# Patient Record
Sex: Male | Born: 1995 | Hispanic: Yes | Marital: Single | Smoking: Current every day smoker
Health system: Southern US, Community
[De-identification: ages and names within clinical notes are randomized; demographics above are authoritative.]

## PROBLEM LIST (undated history)

## (undated) DIAGNOSIS — J45909 Unspecified asthma, uncomplicated: Secondary | ICD-10-CM

---

## 2019-02-03 ENCOUNTER — Other Ambulatory Visit: Payer: Self-pay | Admitting: Orthopaedic Surgery

## 2019-02-03 ENCOUNTER — Encounter (HOSPITAL_COMMUNITY): Payer: Self-pay | Admitting: *Deleted

## 2019-02-03 ENCOUNTER — Other Ambulatory Visit: Payer: Self-pay

## 2019-02-03 NOTE — Progress Notes (Signed)
Patient informed of the [place] that is currently in effect.  Patient verbalized understanding.  Patient denies shortness of breath, fever, cough and chest pain.  PCP - [facility] [place], [place] Cardiologist - Denies  Chest x-ray - Denies EKG - Denies Stress Test - Denies ECHO - Denies Cardiac Cath -Denies   ERAS: Clears til 4:30 am.  No ERAS Drink  STOP now taking any Aspirin (unless otherwise instructed by your surgeon), Aleve, Naproxen, Ibuprofen, Motrin, Advil, Goody's, BC's, all herbal medications, fish oil, and all vitamins.   Coronavirus Screening Have you or Sister experienced the following symptoms:  Cough yes/no: No Fever (>100.24F)  yes/no: No Runny nose yes/no: No Sore throat yes/no: No Difficulty breathing/shortness of breath  yes/no: No  Have you or your sister traveled in the last 14 days and where? yes/no: No

## 2019-02-04 ENCOUNTER — Encounter (HOSPITAL_COMMUNITY): Payer: Self-pay

## 2019-02-04 ENCOUNTER — Ambulatory Visit (HOSPITAL_COMMUNITY): Payer: Worker's Compensation | Admitting: Anesthesiology

## 2019-02-04 ENCOUNTER — Other Ambulatory Visit: Payer: Self-pay

## 2019-02-04 ENCOUNTER — Ambulatory Visit (HOSPITAL_COMMUNITY)
Admission: RE | Admit: 2019-02-04 | Discharge: 2019-02-04 | Disposition: A | Discharge status: Home / Self Care | Payer: Worker's Compensation | Attending: Orthopaedic Surgery | Admitting: Orthopaedic Surgery

## 2019-02-04 ENCOUNTER — Encounter (HOSPITAL_COMMUNITY)
Admission: RE | Disposition: A | Discharge status: Home / Self Care | Payer: Self-pay | Source: Home / Self Care | Attending: Orthopaedic Surgery

## 2019-02-04 DIAGNOSIS — Z1159 Encounter for screening for other viral diseases: Secondary | ICD-10-CM | POA: Diagnosis not present

## 2019-02-04 DIAGNOSIS — F17210 Nicotine dependence, cigarettes, uncomplicated: Secondary | ICD-10-CM | POA: Insufficient documentation

## 2019-02-04 DIAGNOSIS — Z8709 Personal history of other diseases of the respiratory system: Secondary | ICD-10-CM | POA: Diagnosis not present

## 2019-02-04 DIAGNOSIS — Y990 Civilian activity done for income or pay: Secondary | ICD-10-CM | POA: Insufficient documentation

## 2019-02-04 DIAGNOSIS — S92422A Displaced fracture of distal phalanx of left great toe, initial encounter for closed fracture: Secondary | ICD-10-CM | POA: Insufficient documentation

## 2019-02-04 DIAGNOSIS — W230XXA Caught, crushed, jammed, or pinched between moving objects, initial encounter: Secondary | ICD-10-CM | POA: Diagnosis not present

## 2019-02-04 DIAGNOSIS — I96 Gangrene, not elsewhere classified: Secondary | ICD-10-CM | POA: Diagnosis not present

## 2019-02-04 HISTORY — PX: CLOSED REDUCTION METATARSAL: SHX5774

## 2019-02-04 HISTORY — PX: AMPUTATION TOE: SHX6595

## 2019-02-04 HISTORY — DX: Unspecified asthma, uncomplicated: J45.909

## 2019-02-04 LAB — HEMOGLOBIN: Hemoglobin: 15.4 g/dL (ref 13.0–17.0)

## 2019-02-04 LAB — SARS CORONAVIRUS 2: SARS Coronavirus 2: NOT DETECTED

## 2019-02-04 SURGERY — CLOSED REDUCTION, FRACTURE, METATARSAL BONE
Anesthesia: General | Site: Foot | Laterality: Left

## 2019-02-04 MED ORDER — CHLORHEXIDINE GLUCONATE 4 % EX LIQD: 60.0000 mL | Freq: Once | CUTANEOUS | Status: DC

## 2019-02-04 MED ORDER — CEFAZOLIN SODIUM-DEXTROSE 2-3 GM-%(50ML) IV SOLR
INTRAVENOUS | Status: DC | PRN
  Administered 2019-02-04: 2 g via INTRAVENOUS

## 2019-02-04 MED ORDER — OXYCODONE HCL 5 MG PO TABS: 5.0000 mg | ORAL_TABLET | ORAL | 0 refills | Status: AC | PRN

## 2019-02-04 MED ORDER — FENTANYL CITRATE (PF) 250 MCG/5ML IJ SOLN
INTRAMUSCULAR | Status: DC | PRN
  Administered 2019-02-04: 50 ug via INTRAVENOUS
  Administered 2019-02-04: 100 ug via INTRAVENOUS
  Administered 2019-02-04: 50 ug via INTRAVENOUS

## 2019-02-04 MED ORDER — PROPOFOL 10 MG/ML IV BOLUS
INTRAVENOUS | Status: DC | PRN
  Administered 2019-02-04: 100 mg via INTRAVENOUS
  Administered 2019-02-04: 200 mg via INTRAVENOUS

## 2019-02-04 MED ORDER — ONDANSETRON HCL 4 MG/2ML IJ SOLN
INTRAMUSCULAR | Status: DC | PRN
  Administered 2019-02-04: 4 mg via INTRAVENOUS

## 2019-02-04 MED ORDER — FENTANYL CITRATE (PF) 100 MCG/2ML IJ SOLN: 25.0000 ug | INTRAMUSCULAR | Status: DC | PRN

## 2019-02-04 MED ORDER — MIDAZOLAM HCL 2 MG/2ML IJ SOLN
INTRAMUSCULAR | Status: AC
  Filled 2019-02-04: qty 2

## 2019-02-04 MED ORDER — DEXAMETHASONE SODIUM PHOSPHATE 10 MG/ML IJ SOLN
INTRAMUSCULAR | Status: AC
  Filled 2019-02-04: qty 2

## 2019-02-04 MED ORDER — MIDAZOLAM HCL 5 MG/5ML IJ SOLN
INTRAMUSCULAR | Status: DC | PRN
  Administered 2019-02-04: 2 mg via INTRAVENOUS

## 2019-02-04 MED ORDER — 0.9 % SODIUM CHLORIDE (POUR BTL) OPTIME
TOPICAL | Status: DC | PRN
  Administered 2019-02-04: 1000 mL

## 2019-02-04 MED ORDER — DEXAMETHASONE SODIUM PHOSPHATE 10 MG/ML IJ SOLN
INTRAMUSCULAR | Status: DC | PRN
  Administered 2019-02-04: 10 mg via INTRAVENOUS

## 2019-02-04 MED ORDER — LIDOCAINE HCL (CARDIAC) PF 100 MG/5ML IV SOSY
PREFILLED_SYRINGE | INTRAVENOUS | Status: DC | PRN
  Administered 2019-02-04: 60 mg via INTRAVENOUS

## 2019-02-04 MED ORDER — OXYCODONE HCL 5 MG PO TABS
ORAL_TABLET | ORAL | Status: AC
  Filled 2019-02-04: qty 1

## 2019-02-04 MED ORDER — VANCOMYCIN HCL IN DEXTROSE 1-5 GM/200ML-% IV SOLN: 1000.0000 mg | INTRAVENOUS | Status: DC

## 2019-02-04 MED ORDER — PROMETHAZINE HCL 25 MG/ML IJ SOLN: 6.2500 mg | INTRAMUSCULAR | Status: DC | PRN

## 2019-02-04 MED ORDER — BUPIVACAINE HCL (PF) 0.5 % IJ SOLN
INTRAMUSCULAR | Status: AC
  Filled 2019-02-04: qty 30

## 2019-02-04 MED ORDER — ACETAMINOPHEN 10 MG/ML IV SOLN: 1000.0000 mg | Freq: Once | INTRAVENOUS | Status: DC | PRN

## 2019-02-04 MED ORDER — DEXMEDETOMIDINE HCL 200 MCG/2ML IV SOLN
INTRAVENOUS | Status: DC | PRN
  Administered 2019-02-04: 20 ug via INTRAVENOUS
  Administered 2019-02-04: 8 ug via INTRAVENOUS

## 2019-02-04 MED ORDER — LIDOCAINE 2% (20 MG/ML) 5 ML SYRINGE
INTRAMUSCULAR | Status: AC
  Filled 2019-02-04: qty 5

## 2019-02-04 MED ORDER — CEPHALEXIN 500 MG PO CAPS: 500.0000 mg | ORAL_CAPSULE | Freq: Four times a day (QID) | ORAL | 0 refills | Status: AC

## 2019-02-04 MED ORDER — LACTATED RINGERS IV SOLN
INTRAVENOUS | Status: DC
  Administered 2019-02-04 (×2): via INTRAVENOUS

## 2019-02-04 MED ORDER — OXYCODONE HCL 5 MG/5ML PO SOLN: 5.0000 mg | Freq: Once | ORAL | Status: AC | PRN

## 2019-02-04 MED ORDER — BUPIVACAINE HCL (PF) 0.5 % IJ SOLN
INTRAMUSCULAR | Status: DC | PRN
  Administered 2019-02-04: 20 mL

## 2019-02-04 MED ORDER — OXYCODONE HCL 5 MG PO TABS
5.0000 mg | ORAL_TABLET | Freq: Once | ORAL | Status: AC | PRN
  Administered 2019-02-04: 16:00:00 5 mg via ORAL

## 2019-02-04 MED ORDER — FENTANYL CITRATE (PF) 250 MCG/5ML IJ SOLN
INTRAMUSCULAR | Status: AC
  Filled 2019-02-04: qty 5

## 2019-02-04 MED ORDER — CEFAZOLIN SODIUM 1 G IJ SOLR
INTRAMUSCULAR | Status: AC
  Filled 2019-02-04: qty 20

## 2019-02-04 MED ORDER — ONDANSETRON HCL 4 MG/2ML IJ SOLN
INTRAMUSCULAR | Status: AC
  Filled 2019-02-04: qty 4

## 2019-02-04 MED ORDER — PROPOFOL 10 MG/ML IV BOLUS
INTRAVENOUS | Status: AC
  Filled 2019-02-04: qty 40

## 2019-02-04 SURGICAL SUPPLY — 33 items
BANDAGE ACE 4X5 VEL STRL LF (GAUZE/BANDAGES/DRESSINGS) ×3 IMPLANT
BNDG COHESIVE 4X5 TAN STRL (GAUZE/BANDAGES/DRESSINGS) ×3 IMPLANT
BNDG ESMARK 4X9 LF (GAUZE/BANDAGES/DRESSINGS) ×3 IMPLANT
COVER SURGICAL LIGHT HANDLE (MISCELLANEOUS) ×3 IMPLANT
DRAPE U-SHAPE 47X51 STRL (DRAPES) ×3 IMPLANT
DURAPREP 26ML APPLICATOR (WOUND CARE) IMPLANT
ELECT REM PT RETURN 9FT ADLT (ELECTROSURGICAL) ×3
ELECTRODE REM PT RTRN 9FT ADLT (ELECTROSURGICAL) ×1 IMPLANT
GAUZE SPONGE 4X4 12PLY STRL (GAUZE/BANDAGES/DRESSINGS) ×3 IMPLANT
GAUZE XEROFORM 1X8 LF (GAUZE/BANDAGES/DRESSINGS) ×3 IMPLANT
GLOVE BIOGEL PI IND STRL 9 (GLOVE) ×1 IMPLANT
GLOVE BIOGEL PI INDICATOR 9 (GLOVE) ×2
GLOVE SURG ORTHO 9.0 STRL STRW (GLOVE) ×3 IMPLANT
GOWN STRL REUS W/ TWL LRG LVL3 (GOWN DISPOSABLE) ×1 IMPLANT
GOWN STRL REUS W/ TWL XL LVL3 (GOWN DISPOSABLE) ×1 IMPLANT
GOWN STRL REUS W/TWL LRG LVL3 (GOWN DISPOSABLE) ×2
GOWN STRL REUS W/TWL XL LVL3 (GOWN DISPOSABLE) ×2
KIT BASIN OR (CUSTOM PROCEDURE TRAY) ×3 IMPLANT
KIT TURNOVER KIT B (KITS) ×3 IMPLANT
NEEDLE 22X1 1/2 (OR ONLY) (NEEDLE) ×3 IMPLANT
NS IRRIG 1000ML POUR BTL (IV SOLUTION) ×3 IMPLANT
PACK ORTHO EXTREMITY (CUSTOM PROCEDURE TRAY) ×3 IMPLANT
PAD ARMBOARD 7.5X6 YLW CONV (MISCELLANEOUS) ×3 IMPLANT
PAD CAST 4YDX4 CTTN HI CHSV (CAST SUPPLIES) ×1 IMPLANT
PADDING CAST COTTON 4X4 STRL (CAST SUPPLIES) ×2
SUT ETHILON 3 0 PS 1 (SUTURE) ×6 IMPLANT
SUT MON AB 3-0 SH 27 (SUTURE) ×2
SUT MON AB 3-0 SH27 (SUTURE) ×1 IMPLANT
SYR CONTROL 10ML LL (SYRINGE) ×3 IMPLANT
TOWEL NATURAL 10PK STERILE (DISPOSABLE) ×3 IMPLANT
TUBE CONNECTING 12'X1/4 (SUCTIONS) ×1
TUBE CONNECTING 12X1/4 (SUCTIONS) ×2 IMPLANT
YANKAUER SUCT BULB TIP NO VENT (SUCTIONS) ×3 IMPLANT

## 2019-02-04 NOTE — H&P (Signed)
Douglas Reyes is an 23 y.o. male.   Chief Complaint: Left foot crush injury at work on 01/07/2019 with second and third toe tip necrosis and hallux distal phalanx fracture HPI: Douglas Reyes is here today for second and third toe amputation and closed treatment of his hallux fracture.  He sustained an injury on 01/07/2019 and was I indeed and closed at an outside facility.  He had open second third toe fractures.  Unfortunately he went on to dry gangrene of his second and third toes distal to the soft tissue repair.  He is here today for partial second third toe amputations.  Patient complains of continued pain in the forefoot.  He has been decreasing.  He has been using a postoperative shoe and heel weightbearing.  He denies any fevers or chills.  Past Medical History:  Diagnosis Date  . Asthma    as a child, no problems as an adult, no inhaler    History reviewed. No pertinent surgical history.  History reviewed. No pertinent family history. Social History:  reports that he has been smoking cigarettes. He has a 0.30 pack-year smoking history. He has never used smokeless tobacco. He reports current alcohol use of about 8.0 standard drinks of alcohol per week. He reports that he does not use drugs.  Allergies: No Known Allergies  No medications prior to admission.    No results found for this or any previous visit (from the past 48 hour(s)). No results found.  Review of Systems  Constitutional: Negative.   Eyes: Negative.   Respiratory: Negative.   Cardiovascular: Negative.   Gastrointestinal: Negative.   Genitourinary: Negative.   Musculoskeletal:       Left foot pain  Skin: Negative.   Neurological: Negative.   Psychiatric/Behavioral: Negative.     Blood pressure 140/84, pulse 84, temperature 97.8 F (36.6 C), temperature source Oral, resp. rate 20, height 5\' 11"  (1.803 m), weight 115.7 kg, SpO2 98 %. Physical Exam  Constitutional: He appears well-developed.  HENT:   Head: Normocephalic.  Eyes: Conjunctivae are normal.  Neck: Neck supple.  Cardiovascular: Normal rate.  Respiratory: Effort normal.  GI: Soft.  Genitourinary:    Penis normal.   Musculoskeletal:     Comments: Left foot in a dressing.  He has swelling with the hallux, second and third toes.  There is dry gangrene necrosis of the second and third toes from at least the level of the PIP joint distally.  Fourth and fifth toes appear without injury.  Foot is swollen otherwise.  He endorses sensation to light touch about the tip of the hallux as well as dorsal and plantar forefoot.  No sign of obvious infection.  With the exception of the second and third toes the rest of the foot is warm and well-perfused.  Neurological: He is alert.  Skin: Skin is warm.  Psychiatric: He has a normal mood and affect.     Assessment/Plan We will proceed with second and third toe amputation of the close treatment of his hallux distal phalanx fracture.  We discussed the risk, benefits alternatives surgery which included but were not limited to wound healing complications, infection, need for further surgery, demonstrating structures, continued development of dry gangrene, continued pain.  We also discussed the perioperative and anesthetic risk which include death.  After weighing these risks he wished to proceed.  Erle Crocker, MD 02/04/2019, 1:42 PM

## 2019-02-04 NOTE — Discharge Instructions (Signed)
DR. Lucia Gaskins FOOT & ANKLE SURGERY POST-OP INSTRUCTIONS   Pain Management 1. The numbing medicine and your leg will last around 8 hours, take a dose of your pain medicine as soon as you feel it wearing off to avoid rebound pain. 2. Keep your foot elevated above heart level.  Make sure that your heel hangs free ('floats'). 3. Take all prescribed medication as directed. 4. If taking narcotic pain medication you may want to use an over-the-counter stool softener to avoid constipation. 5. You may take over-the-counter NSAIDs (ibuprofen, naproxen, etc.) as well as over-the-counter acetaminophen as directed on the packaging as a supplement for your pain and may also use it to wean away from the prescription medication.  Activity ? Heel Weightbearing in post operative shoe ? Postoperatively, you will be placed into a splint which stays on for 2 weeks and then will be changed at your first postop visit.  First Postoperative Visit 1. Your first postop visit will be at least 2 weeks after surgery.  This should be scheduled when you schedule surgery. 2. If you do not have a postoperative visit scheduled please call [phone] to schedule an appointment. 3. At the appointment your incision will be evaluated for suture removal, x-rays will be obtained if necessary.  General Instructions 1. Swelling is very common after foot and ankle surgery.  It often takes 3 months for the foot and ankle to begin to feel comfortable.  Some amount of swelling will persist for 6-12 months. 2. DO NOT change the dressing.  If there is a problem with the dressing (too tight, loose, gets wet, etc.) please contact Dr. Pollie Friar office. 3. DO NOT get the dressing wet.  For showers you can use an over-the-counter cast cover or wrap a washcloth around the top of your dressing and then cover it with a plastic bag and tape it to your leg. 4. DO NOT soak the incision (no tubs, pools, bath, etc.) until you have approval from Dr.  Lucia Gaskins.  Contact Dr. Huel Cote office or go to Emergency Room if: 1. Temperature above 101 F. 2. Increasing pain that is unresponsive to pain medication or elevation 3. Excessive redness or swelling in your foot 4. Dressing problems - excessive bloody drainage, looseness or tightness, or if dressing gets wet 5. Develop pain, swelling, warmth, or discoloration of your calf

## 2019-02-04 NOTE — Op Note (Signed)
Douglas CornersOswaldo Castaneda Reyes male 23 y.o. 02/04/2019  PreOperative Diagnosis: Left second toe necrosis Left third toe necrosis Left hallux distal phalanx fracture  PostOperative Diagnosis: Same  PROCEDURE: Left second toe partial amputation Left third toe partial amputation Closed treatment of left hallux distal phalanx fracture  SURGEON: Dub Mikeshristopher Marice Angelino, MD  ASSISTANT: None  ANESTHESIA: General LMA with local digital blockade using quarter percent Marcaine plain  FINDINGS: Necrosis to the left second toe Necrosis to the left third toe There appeared to be early wet gangrene forming within the necrotic soft tissue of both the second and third necrotic toes  IMPLANTS: None  INDICATIONS:23 y.o. male sustained a crush injury to his left foot on Jan 07, 2019.  He was seen in outside facility and diagnosed with open fractures and soft tissue injury.  He underwent irrigation debridement and closure.  He was seen by me due to the nature of his injury.  He developed necrosis of the second and third toes distal to the site of repair.  He also had a distal phalanx fracture of his hallux.  Due to the necrosis of his skin and tissues he was indicated for partial tip amputations of his toes.  We discussed the risk benefits alternatives surgery which included wound healing complications, continued infection, need for further surgery, pain and he wished to proceed.  PROCEDURE: Patient identified in the preoperative holding area.  The left leg was marked by myself.  Consent was signed by myself and the patient.  Taken operatively supine operative table.  General LMA anesthesia was induced of difficulty.  He was given preoperative antibiotics.  The left lower extremity was prepped and draped in usual sterile fashion.  Surgical timeout was performed.  We began by cutting at the edge of the necrosis and nonviable skin on the second toe.  This was taken circumferentially about the toe.  This was  proximal to the level of the DIP joint which was disarticulated.  Then the PIP joint was identified and the soft tissue was elevated to this joint area.  The PIP joint was disarticulated and there was still not enough soft tissue coverage and therefore the bone was cut with a bone cutter proximal to the condyles of the proximal phalanx.  This allowed for adequate soft tissue closure.  The soft tissue was inspected for viability and necrotic soft tissue was debrided with 15 blade and excised.  We then turned our attention to the third toe.  An incision was made full-thickness on the edge of the necrosis of viable skin.  This taken down to bone.  This was at the level of the DIP joint which again was disarticulated.  We then elevated soft tissue flaps around the middle phalanx back to the PIP joint.  This again was disarticulated and the middle phalanx was removed.  There was an adequate soft tissue coverage to cover the condyle and therefore the condyle was removed of the proximal phalanx.  Then again the tissue was inspected for viability.  Using a 3-0 Monocryl the deep tissue was closed overlying the bone end of the second third toe.  Then 3-0 nylon was used to close the tissue completely.  There was a tension-free closure of the second and third toe.  The fracture of the distal phalanx of the hallux was well reduced and therefore plan treat this in a closed fashion.  The foot was cleaned and soft dressing of Xeroform, 4 x 4's, sterile sheet cotton and Ace wrap were placed.  Their counts are correct at the end the case.  There were no complications.  He tolerated this well.  He was awakened from anesthesia and taken to recovery in stable condition.  No complications.  POST OPERATIVE INSTRUCTIONS: Heel weightbearing left lower extremity Keep dressing in place until follow-up Keep dressing dry. Elevate limb Call the office with concerns He will follow-up in 2 weeks for wound check and possible suture  removal no x-rays needed He will take Keflex x5 days for infection prophylaxis  TOURNIQUET TIME: No tourniquet was used  BLOOD LOSS:  Minimal         DRAINS: none         SPECIMEN: none       COMPLICATIONS:  * No complications entered in OR log *         Disposition: PACU - hemodynamically stable.         Condition: stable

## 2019-02-04 NOTE — Anesthesia Postprocedure Evaluation (Signed)
Anesthesia Post Note  Patient: Douglas Reyes  Procedure(s) Performed: CLOSED REDUCTION OF HALLUX FRACTURE (Left Foot) AMPUTATION LEFT 2ND AND 3RD TOE (Left Foot)     Patient location during evaluation: PACU Anesthesia Type: General Level of consciousness: awake and alert Pain management: pain level controlled Vital Signs Assessment: post-procedure vital signs reviewed and stable Respiratory status: spontaneous breathing, nonlabored ventilation, respiratory function stable and patient connected to nasal cannula oxygen Cardiovascular status: blood pressure returned to baseline and stable Postop Assessment: no apparent nausea or vomiting Anesthetic complications: no    Last Vitals:  Vitals:   02/04/19 1630 02/04/19 1645  BP:  122/66  Pulse: 85   Resp: 14   Temp:  (!) 36.1 C  SpO2: 98%     Last Pain:  Vitals:   02/04/19 1645  TempSrc:   PainSc: 1                  Effie Berkshire

## 2019-02-04 NOTE — Anesthesia Postprocedure Evaluation (Deleted)
Anesthesia Post Note  Patient: Douglas Reyes  Procedure(s) Performed: CLOSED REDUCTION OF HALLUX FRACTURE (Left Foot) AMPUTATION LEFT 2ND AND 3RD TOE (Left Foot)     Patient location during evaluation: PACU Anesthesia Type: General Level of consciousness: awake and alert Pain management: pain level controlled Vital Signs Assessment: post-procedure vital signs reviewed and stable Respiratory status: spontaneous breathing, nonlabored ventilation, respiratory function stable and patient connected to nasal cannula oxygen Cardiovascular status: blood pressure returned to baseline and stable Postop Assessment: no apparent nausea or vomiting Anesthetic complications: no    Last Vitals:  Vitals:   02/04/19 1201  BP: 140/84  Pulse: 84  Resp: 20  Temp: 36.6 C  SpO2: 98%    Last Pain:  Vitals:   02/04/19 1249  TempSrc:   PainSc: 0-No pain                 Effie Berkshire

## 2019-02-04 NOTE — Anesthesia Procedure Notes (Signed)
Procedure Name: LMA Insertion Date/Time: 02/04/2019 3:03 PM Performed by: Mariea Clonts, CRNA Pre-anesthesia Checklist: Patient identified, Emergency Drugs available, Suction available and Patient being monitored Patient Re-evaluated:Patient Re-evaluated prior to induction Oxygen Delivery Method: Circle System Utilized Preoxygenation: Pre-oxygenation with 100% oxygen Induction Type: IV induction Ventilation: Mask ventilation without difficulty LMA: LMA inserted LMA Size: 5.0 Number of attempts: 1 Airway Equipment and Method: Bite block Placement Confirmation: positive ETCO2 Tube secured with: Tape Dental Injury: Teeth and Oropharynx as per pre-operative assessment

## 2019-02-04 NOTE — Anesthesia Preprocedure Evaluation (Addendum)
Anesthesia Evaluation  Patient identified by MRN, date of birth, ID band Patient awake    Reviewed: Allergy & Precautions, NPO status , Patient's Chart, lab work & pertinent test results  Airway Mallampati: I  TM Distance: >3 FB Neck ROM: Full    Dental  (+) Teeth Intact, Dental Advisory Given   Pulmonary Current Smoker,    breath sounds clear to auscultation       Cardiovascular negative cardio ROS   Rhythm:Regular Rate:Normal     Neuro/Psych negative neurological ROS     GI/Hepatic negative GI ROS, Neg liver ROS,   Endo/Other  negative endocrine ROS  Renal/GU negative Renal ROS     Musculoskeletal negative musculoskeletal ROS (+)   Abdominal (+) + obese,   Peds  Hematology negative hematology ROS (+)   Anesthesia Other Findings Day of surgery medications reviewed with the patient.  Reproductive/Obstetrics                            Anesthesia Physical Anesthesia Plan  ASA: I  Anesthesia Plan: General   Post-op Pain Management:    Induction: Intravenous  PONV Risk Score and Plan: 2 and Ondansetron, Dexamethasone and Midazolam  Airway Management Planned: LMA  Additional Equipment: None  Intra-op Plan:   Post-operative Plan: Extubation in OR  Informed Consent: I have reviewed the patients History and Physical, chart, labs and discussed the procedure including the risks, benefits and alternatives for the proposed anesthesia with the patient or authorized representative who has indicated his/her understanding and acceptance.     Dental advisory given  Plan Discussed with: CRNA  Anesthesia Plan Comments:        Anesthesia Quick Evaluation

## 2019-02-04 NOTE — Transfer of Care (Signed)
Immediate Anesthesia Transfer of Care Note  Patient: Douglas Reyes  Procedure(s) Performed: CLOSED REDUCTION OF HALLUX FRACTURE (Left Foot) AMPUTATION LEFT 2ND AND 3RD TOE (Left Foot)  Patient Location: PACU  Anesthesia Type:General  Level of Consciousness: awake, alert  and oriented  Airway & Oxygen Therapy: Patient Spontanous Breathing and Patient connected to nasal cannula oxygen  Post-op Assessment: Report given to RN, Post -op Vital signs reviewed and stable and Patient moving all extremities X 4  Post vital signs: Reviewed and stable  Last Vitals:  Vitals Value Taken Time  BP 117/75 02/04/19 1556  Temp    Pulse 81 02/04/19 1610  Resp 18 02/04/19 1610  SpO2 99 % 02/04/19 1610  Vitals shown include unvalidated device data.  Last Pain:  Vitals:   02/04/19 1249  TempSrc:   PainSc: 0-No pain         Complications: No apparent anesthesia complications

## 2019-02-05 ENCOUNTER — Encounter (HOSPITAL_COMMUNITY): Payer: Self-pay | Admitting: Orthopaedic Surgery

## 2019-02-07 ENCOUNTER — Emergency Department (HOSPITAL_COMMUNITY): Payer: HRSA Program

## 2019-02-07 ENCOUNTER — Encounter (HOSPITAL_COMMUNITY): Payer: Self-pay

## 2019-02-07 ENCOUNTER — Observation Stay (HOSPITAL_COMMUNITY)
Admission: EM | Admit: 2019-02-07 | Discharge: 2019-02-08 | Disposition: A | Discharge status: Home / Self Care | Payer: HRSA Program | Attending: Internal Medicine | Admitting: Internal Medicine

## 2019-02-07 DIAGNOSIS — A419 Sepsis, unspecified organism: Secondary | ICD-10-CM | POA: Diagnosis not present

## 2019-02-07 DIAGNOSIS — Z716 Tobacco abuse counseling: Secondary | ICD-10-CM | POA: Diagnosis not present

## 2019-02-07 DIAGNOSIS — J069 Acute upper respiratory infection, unspecified: Secondary | ICD-10-CM | POA: Diagnosis not present

## 2019-02-07 DIAGNOSIS — Z89422 Acquired absence of other left toe(s): Secondary | ICD-10-CM | POA: Diagnosis not present

## 2019-02-07 DIAGNOSIS — R6510 Systemic inflammatory response syndrome (SIRS) of non-infectious origin without acute organ dysfunction: Secondary | ICD-10-CM

## 2019-02-07 DIAGNOSIS — F17210 Nicotine dependence, cigarettes, uncomplicated: Secondary | ICD-10-CM | POA: Diagnosis not present

## 2019-02-07 DIAGNOSIS — Z720 Tobacco use: Secondary | ICD-10-CM | POA: Diagnosis present

## 2019-02-07 DIAGNOSIS — S98132A Complete traumatic amputation of one left lesser toe, initial encounter: Secondary | ICD-10-CM | POA: Diagnosis present

## 2019-02-07 DIAGNOSIS — U071 COVID-19: Secondary | ICD-10-CM | POA: Diagnosis not present

## 2019-02-07 LAB — COMPREHENSIVE METABOLIC PANEL
ALT: 132 U/L — ABNORMAL HIGH (ref 0–44)
AST: 54 U/L — ABNORMAL HIGH (ref 15–41)
Albumin: 4.3 g/dL (ref 3.5–5.0)
Alkaline Phosphatase: 49 U/L (ref 38–126)
Anion gap: 10 (ref 5–15)
BUN: 11 mg/dL (ref 6–20)
CO2: 22 mmol/L (ref 22–32)
Calcium: 9.2 mg/dL (ref 8.9–10.3)
Chloride: 104 mmol/L (ref 98–111)
Creatinine, Ser: 0.96 mg/dL (ref 0.61–1.24)
GFR calc Af Amer: >60 mL/min (ref >60)
GFR calc non Af Amer: >60 mL/min (ref >60)
Glucose, Bld: 113 mg/dL — ABNORMAL HIGH (ref 70–99)
Potassium: 4.1 mmol/L (ref 3.5–5.1)
Sodium: 136 mmol/L (ref 135–145)
Total Bilirubin: 0.6 mg/dL (ref 0.3–1.2)
Total Protein: 7.3 g/dL (ref 6.5–8.1)

## 2019-02-07 LAB — CBC WITH DIFFERENTIAL/PLATELET
Abs Immature Granulocytes: 0.15 10*3/uL — ABNORMAL HIGH (ref 0.00–0.07)
Basophils Absolute: 0.1 10*3/uL (ref 0.0–0.1)
Basophils Relative: 1 %
Eosinophils Absolute: 0.1 10*3/uL (ref 0.0–0.5)
Eosinophils Relative: 2 %
HCT: 48.4 % (ref 39.0–52.0)
Hemoglobin: 16.4 g/dL (ref 13.0–17.0)
Immature Granulocytes: 2 %
Lymphocytes Relative: 14 %
Lymphs Abs: 1 10*3/uL (ref 0.7–4.0)
MCH: 28 pg (ref 26.0–34.0)
MCHC: 33.9 g/dL (ref 30.0–36.0)
MCV: 82.7 fL (ref 80.0–100.0)
Monocytes Absolute: 1.3 10*3/uL — ABNORMAL HIGH (ref 0.1–1.0)
Monocytes Relative: 17 %
Neutro Abs: 4.9 10*3/uL (ref 1.7–7.7)
Neutrophils Relative %: 64 %
Platelets: 188 10*3/uL (ref 150–400)
RBC: 5.85 MIL/uL — ABNORMAL HIGH (ref 4.22–5.81)
RDW: 12.3 % (ref 11.5–15.5)
WBC: 7.6 10*3/uL (ref 4.0–10.5)
nRBC: 0 % (ref 0.0–0.2)

## 2019-02-07 LAB — LACTIC ACID, PLASMA: Lactic Acid, Venous: 1.4 mmol/L (ref 0.5–1.9)

## 2019-02-07 MED ORDER — SODIUM CHLORIDE 0.9% FLUSH
3.0000 mL | Freq: Once | INTRAVENOUS | Status: AC
  Administered 2019-02-08: 3 mL via INTRAVENOUS

## 2019-02-07 NOTE — ED Triage Notes (Signed)
Pt states that last Thursday he had two of his toe amputated on his L foot, today he started to run a fever of 101 yesterday, pt also reports cough since yesterday,  abd pain and headache.

## 2019-02-08 ENCOUNTER — Other Ambulatory Visit: Payer: Self-pay

## 2019-02-08 ENCOUNTER — Emergency Department (HOSPITAL_COMMUNITY): Payer: HRSA Program

## 2019-02-08 ENCOUNTER — Encounter (HOSPITAL_COMMUNITY): Payer: Self-pay | Admitting: Emergency Medicine

## 2019-02-08 DIAGNOSIS — U071 COVID-19: Principal | ICD-10-CM

## 2019-02-08 DIAGNOSIS — S98132A Complete traumatic amputation of one left lesser toe, initial encounter: Secondary | ICD-10-CM | POA: Diagnosis not present

## 2019-02-08 DIAGNOSIS — A419 Sepsis, unspecified organism: Secondary | ICD-10-CM

## 2019-02-08 DIAGNOSIS — J069 Acute upper respiratory infection, unspecified: Secondary | ICD-10-CM | POA: Diagnosis not present

## 2019-02-08 DIAGNOSIS — J45909 Unspecified asthma, uncomplicated: Secondary | ICD-10-CM | POA: Insufficient documentation

## 2019-02-08 DIAGNOSIS — Z720 Tobacco use: Secondary | ICD-10-CM | POA: Diagnosis not present

## 2019-02-08 HISTORY — DX: COVID-19: U07.1

## 2019-02-08 HISTORY — DX: Sepsis, unspecified organism: A41.9

## 2019-02-08 HISTORY — DX: Acute upper respiratory infection, unspecified: J06.9

## 2019-02-08 LAB — URINALYSIS, ROUTINE W REFLEX MICROSCOPIC
Bilirubin Urine: NEGATIVE
Glucose, UA: NEGATIVE mg/dL
Hgb urine dipstick: NEGATIVE
Ketones, ur: NEGATIVE mg/dL
Leukocytes,Ua: NEGATIVE
Nitrite: NEGATIVE
Protein, ur: NEGATIVE mg/dL
Specific Gravity, Urine: 1.029 (ref 1.005–1.030)
pH: 5 (ref 5.0–8.0)

## 2019-02-08 LAB — LACTATE DEHYDROGENASE: LDH: 170 U/L (ref 98–192)

## 2019-02-08 LAB — RESPIRATORY PANEL BY PCR

## 2019-02-08 LAB — COMPREHENSIVE METABOLIC PANEL
ALT: 119 U/L — ABNORMAL HIGH (ref 0–44)
AST: 47 U/L — ABNORMAL HIGH (ref 15–41)
Albumin: 4 g/dL (ref 3.5–5.0)
Alkaline Phosphatase: 45 U/L (ref 38–126)
Anion gap: 10 (ref 5–15)
BUN: 14 mg/dL (ref 6–20)
CO2: 21 mmol/L — ABNORMAL LOW (ref 22–32)
Calcium: 8.9 mg/dL (ref 8.9–10.3)
Chloride: 103 mmol/L (ref 98–111)
Creatinine, Ser: 1.05 mg/dL (ref 0.61–1.24)
GFR calc Af Amer: >60 mL/min (ref >60)
GFR calc non Af Amer: >60 mL/min (ref >60)
Glucose, Bld: 112 mg/dL — ABNORMAL HIGH (ref 70–99)
Potassium: 3.9 mmol/L (ref 3.5–5.1)
Sodium: 134 mmol/L — ABNORMAL LOW (ref 135–145)
Total Bilirubin: 0.7 mg/dL (ref 0.3–1.2)
Total Protein: 6.7 g/dL (ref 6.5–8.1)

## 2019-02-08 LAB — SARS CORONAVIRUS 2: SARS Coronavirus 2: DETECTED — AB

## 2019-02-08 LAB — PROTIME-INR
INR: 1.1 (ref 0.8–1.2)
Prothrombin Time: 14 seconds (ref 11.4–15.2)

## 2019-02-08 LAB — SEDIMENTATION RATE: Sed Rate: 1 mm/hr (ref 0–16)

## 2019-02-08 LAB — C-REACTIVE PROTEIN: CRP: 1 mg/dL — ABNORMAL HIGH (ref <1.0)

## 2019-02-08 LAB — TROPONIN I: Troponin I: <0.03 ng/mL (ref <0.03)

## 2019-02-08 LAB — TYPE AND SCREEN
ABO/RH(D): A POS
Antibody Screen: NEGATIVE

## 2019-02-08 LAB — HIV ANTIBODY (ROUTINE TESTING W REFLEX): HIV Screen 4th Generation wRfx: NONREACTIVE

## 2019-02-08 LAB — FIBRINOGEN: Fibrinogen: 379 mg/dL (ref 210–475)

## 2019-02-08 LAB — LACTIC ACID, PLASMA
Lactic Acid, Venous: 0.7 mmol/L (ref 0.5–1.9)
Lactic Acid, Venous: 1.2 mmol/L (ref 0.5–1.9)

## 2019-02-08 LAB — PROCALCITONIN: Procalcitonin: <0.1 ng/mL

## 2019-02-08 LAB — ABO/RH: ABO/RH(D): A POS

## 2019-02-08 LAB — TRIGLYCERIDES: Triglycerides: 78 mg/dL (ref <150)

## 2019-02-08 LAB — BRAIN NATRIURETIC PEPTIDE: B Natriuretic Peptide: 5.3 pg/mL (ref 0.0–100.0)

## 2019-02-08 LAB — FERRITIN: Ferritin: 203 ng/mL (ref 24–336)

## 2019-02-08 LAB — D-DIMER, QUANTITATIVE: D-Dimer, Quant: 0.43 ug/mL-FEU (ref 0.00–0.50)

## 2019-02-08 MED ORDER — CEPHALEXIN 500 MG PO CAPS
500.0000 mg | ORAL_CAPSULE | Freq: Four times a day (QID) | ORAL | Status: DC
  Administered 2019-02-08 (×2): 500 mg via ORAL
  Filled 2019-02-08 (×2): qty 1

## 2019-02-08 MED ORDER — DM-GUAIFENESIN ER 30-600 MG PO TB12: 1.0000 | ORAL_TABLET | Freq: Two times a day (BID) | ORAL | Status: DC | PRN

## 2019-02-08 MED ORDER — VANCOMYCIN HCL IN DEXTROSE 1-5 GM/200ML-% IV SOLN: 1000.0000 mg | Freq: Once | INTRAVENOUS | Status: DC

## 2019-02-08 MED ORDER — LEVALBUTEROL HCL 1.25 MG/0.5ML IN NEBU
1.2500 mg | INHALATION_SOLUTION | Freq: Four times a day (QID) | RESPIRATORY_TRACT | Status: DC
  Filled 2019-02-08: qty 0.5

## 2019-02-08 MED ORDER — NICOTINE 21 MG/24HR TD PT24: 21.0000 mg | MEDICATED_PATCH | Freq: Every day | TRANSDERMAL | 0 refills | Status: DC

## 2019-02-08 MED ORDER — OXYCODONE HCL 5 MG PO TABS: 5.0000 mg | ORAL_TABLET | ORAL | Status: DC | PRN

## 2019-02-08 MED ORDER — ACETAMINOPHEN 500 MG PO TABS
1000.0000 mg | ORAL_TABLET | Freq: Once | ORAL | Status: AC
  Administered 2019-02-08: 01:00:00 1000 mg via ORAL
  Filled 2019-02-08: qty 2

## 2019-02-08 MED ORDER — ENOXAPARIN SODIUM 40 MG/0.4ML ~~LOC~~ SOLN
40.0000 mg | SUBCUTANEOUS | Status: DC
  Administered 2019-02-08: 40 mg via SUBCUTANEOUS
  Filled 2019-02-08: qty 0.4

## 2019-02-08 MED ORDER — SODIUM CHLORIDE 0.9 % IV SOLN
2.0000 g | Freq: Once | INTRAVENOUS | Status: AC
  Administered 2019-02-08: 01:00:00 2 g via INTRAVENOUS
  Filled 2019-02-08: qty 2

## 2019-02-08 MED ORDER — ONDANSETRON HCL 4 MG/2ML IJ SOLN: 4.0000 mg | Freq: Three times a day (TID) | INTRAMUSCULAR | Status: DC | PRN

## 2019-02-08 MED ORDER — METRONIDAZOLE IN NACL 5-0.79 MG/ML-% IV SOLN
500.0000 mg | Freq: Once | INTRAVENOUS | Status: AC
  Administered 2019-02-08: 500 mg via INTRAVENOUS
  Filled 2019-02-08: qty 100

## 2019-02-08 MED ORDER — ACETAMINOPHEN 325 MG PO TABS: 650.0000 mg | ORAL_TABLET | Freq: Four times a day (QID) | ORAL | Status: DC | PRN

## 2019-02-08 MED ORDER — NICOTINE 21 MG/24HR TD PT24
21.0000 mg | MEDICATED_PATCH | Freq: Every day | TRANSDERMAL | Status: DC
  Filled 2019-02-08: qty 1

## 2019-02-08 MED ORDER — VANCOMYCIN HCL 10 G IV SOLR
2500.0000 mg | Freq: Once | INTRAVENOUS | Status: AC
  Administered 2019-02-08: 02:00:00 2500 mg via INTRAVENOUS
  Filled 2019-02-08: qty 2500

## 2019-02-08 NOTE — Discharge Summary (Signed)
Douglas Reyes is a 23 y.o. male with medical history significant of tobacco abuse, childhood asthma, who presents with cough and fever. Recent L foot surgery POD# 4. No new findings on L foot xray. No radiographic signs of osteomyelitis. Discussed with ortho Dr. Berenice Primas who will continue to follow outpatient. Keep scheduled appointment.  Please refer to H&P dictated by Dr Blaine Hamper on 02/08/19 for further details of the assessment and plan.

## 2019-02-08 NOTE — Consult Note (Signed)
[place] Nurse wound consult note Reason for Consult:S/P amputation of left second and third toes 02/04/2019. Readmitted to hospital. No surgical infection noted, instead (+) covid-19.  Is discharging today and needs follow up wound care instruction.  Surgical dressing was removed to assess for any infection.  Replace this dressing with Xeroform and gauze/tape and follow up with orthopedic surgeon next week as already scheduled.  No showering until this appointment. Patient is given this instruction per bedside RN while I am on the phone and he verbalizes understanding. He currently has dressing in place.  Wound type:Trauma then surgical  Pressure Injury POA: NA Measurement: amputation of left second and third toe.  Wound bed: Sutures to left second and third toe site.  Drainage (amount, consistency, odor) minimal serosanguinous Periwound:hallux fracture repaired during surgery.  Otherwise intact Dressing procedure/placement/frequency: Xeroform dressing in place.  Remain in place until follow up appointment next week;  Patient informed.  Will not follow at this time.  Please re-consult if needed.  Domenic Moras MSN, RN, FNP-BC [facility] Wound, Ostomy, Continence Nurse Pager [phone]

## 2019-02-08 NOTE — Consult Note (Signed)
Reason for Consult:patient being admitted for concerns of sepsis but turns out to have corona virus Referring Physician: hospitalists  Douglas Reyes is an 23 y.o. male.  HPI: the patient underwent amputation surgery of toes several days ago.  He is a relatively healthy person who had uncomplicated surgery.  There were no significant anticipation of surgical problems.  He ultimately presented to the emergency room with concerns of sepsis.  Wound and foot were essentially normal for postoperative condition according to the emergency room physician.  I had a verbal consult with her and we discussed the findings of his wound.  We discussed the possibility of pulmonary embolism given the timing and vital signs.  Ultimately she diagnosed the patient with corona virus.  He is to be admitted and based on this information  I think that we will be available to see him that will not impart additional risk by evaluating him unless he has additional problems with his wound.  Past Medical History:  Diagnosis Date  . Asthma    as a child, no problems as an adult, no inhaler    Past Surgical History:  Procedure Laterality Date  . AMPUTATION TOE Left 02/04/2019   Procedure: AMPUTATION LEFT 2ND AND 3RD TOE;  Surgeon: Erle Crocker, MD;  Location: [facility];  Service: Orthopedics;  Laterality: Left;  . CLOSED REDUCTION METATARSAL Left 02/04/2019   Procedure: CLOSED REDUCTION OF HALLUX FRACTURE;  Surgeon: Erle Crocker, MD;  Location: [facility];  Service: Orthopedics;  Laterality: Left;    No family history on file.  Social History:  reports that he has been smoking cigarettes. He has a 0.30 pack-year smoking history. He has never used smokeless tobacco. He reports current alcohol use of about 8.0 standard drinks of alcohol per week. He reports that he does not use drugs.  Allergies: No Known Allergies  Medications: I have reviewed the patient's current medications.  Results for orders placed  or performed during the hospital encounter of 02/07/19 (from the past 48 hour(s))  Lactic acid, plasma     Status: None   Collection Time: 02/07/19  9:44 PM  Result Value Ref Range   Lactic Acid, Venous 1.4 0.5 - 1.9 mmol/L    Comment: Performed at [facility], [address], [place], [place] [address]  Comprehensive metabolic panel     Status: Abnormal   Collection Time: 02/07/19  9:44 PM  Result Value Ref Range   Sodium 136 135 - 145 mmol/L   Potassium 4.1 3.5 - 5.1 mmol/L   Chloride 104 98 - 111 mmol/L   CO2 22 22 - 32 mmol/L   Glucose, Bld 113 (H) 70 - 99 mg/dL   BUN 11 6 - 20 mg/dL   Creatinine, Ser 0.96 0.61 - 1.24 mg/dL   Calcium 9.2 8.9 - 10.3 mg/dL   Total Protein 7.3 6.5 - 8.1 g/dL   Albumin 4.3 3.5 - 5.0 g/dL   AST 54 (H) 15 - 41 U/L   ALT 132 (H) 0 - 44 U/L   Alkaline Phosphatase 49 38 - 126 U/L   Total Bilirubin 0.6 0.3 - 1.2 mg/dL   GFR calc non Af Amer >60 >60 mL/min   GFR calc Af Amer >60 >60 mL/min   Anion gap 10 5 - 15    Comment: Performed at [facility] [address] [place], [place] [address]  CBC with Differential     Status: Abnormal   Collection Time: 02/07/19  9:44 PM  Result Value Ref Range   WBC 7.6 4.0 - 10.5 K/uL   RBC 5.85 (H) 4.22 - 5.81 MIL/uL   Hemoglobin 16.4 13.0 - 17.0 g/dL   HCT 08.6 57.8 - 46.9 %   MCV 82.7 80.0 - 100.0 fL   MCH 28.0 26.0 - 34.0 pg   MCHC 33.9 30.0 - 36.0 g/dL   RDW 62.9 52.8 - 41.3 %   Platelets 188 150 - 400 K/uL   nRBC 0.0 0.0 - 0.2 %   Neutrophils Relative % 64 %   Neutro Abs 4.9 1.7 - 7.7 K/uL   Lymphocytes Relative 14 %   Lymphs Abs 1.0 0.7 - 4.0 K/uL   Monocytes Relative 17 %   Monocytes Absolute 1.3 (H) 0.1 - 1.0 K/uL   Eosinophils Relative 2 %   Eosinophils Absolute 0.1 0.0 - 0.5 K/uL   Basophils Relative 1 %   Basophils Absolute 0.1 0.0 - 0.1 K/uL   Immature Granulocytes 2 %   Abs Immature Granulocytes 0.15 (H) 0.00 - 0.07 K/uL    Comment: Performed at [facility], [address] [place], [place] [address]  SARS Coronavirus 2     Status: Abnormal   Collection Time: 02/07/19 11:59 PM  Result Value Ref Range   SARS Coronavirus 2 DETECTED (A) NOT DETECTED    Comment: RESULT CALLED TO, READ BACK BY AND VERIFIED WITH: RN Caswell Corwin  02/08/19 BY S GEZAHEGN (NOTE) SARS-CoV-2 target nucleic acids are DETECTED. The SARS-CoV-2 RNA is generally detectable in upper and lower respiratory specimens during the acute phase of infection. Positive results are indicative of active infection with SARS-CoV-2. Clinical  correlation with patient history and other diagnostic information is necessary to determine patient infection status. Positive results do  not rule out bacterial infection or co-infection with other viruses. The expected result is Not Detected. Fact Sheet for Patients: http://www.biofiredefense.com/wp-content/uploads/2020/03/BIOFIRE-COVID -19-patients.pdf Fact Sheet for Healthcare Providers: http://www.biofiredefense.com/wp-content/uploads/2020/03/BIOFIRE-COVID -19-hcp.pdf This test is not yet approved or cleared by the [place] and  has been authorized for detection and/or diagnosis of SARS-CoV-2 by FDA under an Emergen cy Use Authorization (EUA).  This EUA will remain in effect (meaning this test can be used) for the duration of  the COVID-19 declaration under Section 564(b)(1) of the Act, 21 U.S.C. section [phone] 3(b)(1), unless the authorization is terminated or revoked sooner. Performed at [facility], [address], [place], [place] [address]   Lactic acid, plasma     Status: None   Collection Time: 02/08/19 12:03 AM  Result Value Ref Range   Lactic Acid, Venous 1.2 0.5 - 1.9 mmol/L    Comment: Performed at [facility], [address], [place], [place] [address]  Urinalysis, Routine w reflex microscopic     Status: Abnormal   Collection Time: 02/08/19 12:40 AM  Result Value Ref Range   Color, Urine AMBER (A) YELLOW     Comment: BIOCHEMICALS MAY BE AFFECTED BY COLOR   APPearance CLEAR CLEAR   Specific Gravity, Urine 1.029 1.005 - 1.030   pH 5.0 5.0 - 8.0   Glucose, UA NEGATIVE NEGATIVE mg/dL   Hgb urine dipstick NEGATIVE NEGATIVE   Bilirubin Urine NEGATIVE NEGATIVE   Ketones, ur NEGATIVE NEGATIVE mg/dL   Protein, ur NEGATIVE NEGATIVE mg/dL   Nitrite NEGATIVE NEGATIVE   Leukocytes,Ua NEGATIVE NEGATIVE    Comment: Performed at [facility], [address], [place], [place] [address]  Comprehensive metabolic panel  Status: Abnormal   Collection Time: 02/08/19  3:17 AM  Result Value Ref Range   Sodium 134 (L) 135 - 145 mmol/L   Potassium 3.9 3.5 - 5.1 mmol/L   Chloride 103 98 - 111 mmol/L   CO2 21 (L) 22 - 32 mmol/L   Glucose, Bld 112 (H) 70 - 99 mg/dL   BUN 14 6 - 20 mg/dL   Creatinine, Ser 1.61 0.61 - 1.24 mg/dL   Calcium 8.9 8.9 - 09.6 mg/dL   Total Protein 6.7 6.5 - 8.1 g/dL   Albumin 4.0 3.5 - 5.0 g/dL   AST 47 (H) 15 - 41 U/L   ALT 119 (H) 0 - 44 U/L   Alkaline Phosphatase 45 38 - 126 U/L   Total Bilirubin 0.7 0.3 - 1.2 mg/dL   GFR calc non Af Amer >60 >60 mL/min   GFR calc Af Amer >60 >60 mL/min   Anion gap 10 5 - 15    Comment: Performed at [facility], [address], [place], [place] [address]  Lactic acid, plasma     Status: None   Collection Time: 02/08/19  4:42 AM  Result Value Ref Range   Lactic Acid, Venous 0.7 0.5 - 1.9 mmol/L    Comment: Performed at [facility], [address], [place], [place] [address]  Type and screen [facility]     Status: None   Collection Time: 02/08/19  4:42 AM  Result Value Ref Range   ABO/RH(D) A POS    Antibody Screen NEG    Sample Expiration      02/11/2019,2359 Performed at [facility], [address] [place], [place] [address]   ABO/Rh     Status: None (Preliminary result)   Collection Time: 02/08/19  4:42 AM  Result Value Ref Range   ABO/RH(D)      A POS Performed at [facility], [address] [place], [place] [address]   Protime-INR     Status: None   Collection Time: 02/08/19  5:02 AM  Result Value Ref Range   Prothrombin Time 14.0 11.4 - 15.2 seconds   INR 1.1 0.8 - 1.2    Comment: (NOTE) INR goal varies based on device and disease states. Performed at [facility], [address], [place], [place] [address]   Procalcitonin     Status: None   Collection Time: 02/08/19  5:02 AM  Result Value Ref Range   Procalcitonin <0.10 ng/mL    Comment:        Interpretation: PCT (Procalcitonin) <= 0.5 ng/mL: Systemic infection (sepsis) is not likely. Local bacterial infection is possible. (NOTE)       Sepsis PCT Algorithm           Lower Respiratory Tract                                      Infection PCT Algorithm    ----------------------------     ----------------------------         PCT < 0.25 ng/mL                PCT < 0.10 ng/mL         Strongly encourage             Strongly discourage   discontinuation of antibiotics    initiation of antibiotics    ----------------------------     -----------------------------  PCT 0.25 - 0.50 ng/mL            PCT 0.10 - 0.25 ng/mL               OR       >80% decrease in PCT            Discourage initiation of                                            antibiotics      Encourage discontinuation           of antibiotics    ----------------------------     -----------------------------         PCT >= 0.50 ng/mL              PCT 0.26 - 0.50 ng/mL               AND        <80% decrease in PCT             Encourage initiation of                                             antibiotics       Encourage continuation           of antibiotics    ----------------------------     -----------------------------        PCT >= 0.50 ng/mL                  PCT > 0.50 ng/mL               AND         increase in PCT                  Strongly encourage                                      initiation of antibiotics     Strongly encourage escalation           of antibiotics                                     -----------------------------                                           PCT <= 0.25 ng/mL                                                 OR                                        > 80% decrease in PCT  Discontinue / Do not initiate                                             antibiotics Performed at [facility], [address] [place], [place] [address]   Brain natriuretic peptide     Status: None   Collection Time: 02/08/19  5:02 AM  Result Value Ref Range   B Natriuretic Peptide 5.3 0.0 - 100.0 pg/mL    Comment: Performed at [facility], [address] [place], [place] [address]  C-reactive protein     Status: Abnormal   Collection Time: 02/08/19  5:02 AM  Result Value Ref Range   CRP 1.0 (H) <1.0 mg/dL    Comment: Performed at [facility], [address] [place], [place] [address]  D-dimer, quantitative (not at [facility])     Status: None   Collection Time: 02/08/19  5:02 AM  Result Value Ref Range   D-Dimer, Quant 0.43 0.00 - 0.50 ug/mL-FEU    Comment: (NOTE) At the manufacturer cut-off of 0.50 ug/mL FEU, this assay has been documented to exclude PE with a sensitivity and negative predictive value of 97 to 99%.  At this time, this assay has not been approved by the FDA to exclude DVT/VTE. Results should be correlated with clinical presentation. Performed at [facility], [address] [place], [place] [address]   Ferritin     Status: None   Collection Time: 02/08/19  5:02 AM  Result Value Ref Range   Ferritin 203 24 - 336 ng/mL    Comment: Performed at [facility], [address] [place], [place] [address]  Fibrinogen     Status: None   Collection Time: 02/08/19  5:02 AM  Result Value Ref Range   Fibrinogen 379 210 - 475 mg/dL    Comment: Performed at [facility], [address] [place], [place]  [address]  Lactate dehydrogenase     Status: None   Collection Time: 02/08/19  5:02 AM  Result Value Ref Range   LDH 170 98 - 192 U/L    Comment: Performed at [facility], [address] [place], [place] [address]  Triglycerides     Status: None   Collection Time: 02/08/19  5:02 AM  Result Value Ref Range   Triglycerides 78 <150 mg/dL    Comment: Performed at [facility], [address] [place], [place] [address]  Troponin I - Once     Status: None   Collection Time: 02/08/19  5:02 AM  Result Value Ref Range   Troponin I <0.03 <0.03 ng/mL    Comment: Performed at [facility], [address] [place], [place] [address]    Dg Chest 2 View  Result Date: 02/07/2019 CLINICAL DATA:  Cough and fever today. EXAM: CHEST - 2 VIEW COMPARISON:  None. FINDINGS: The cardiomediastinal contours are normal. The lungs are clear. Pulmonary vasculature is normal. No consolidation, pleural effusion, or pneumothorax. No acute osseous abnormalities are seen. IMPRESSION: Unremarkable radiographs of the chest. Electronically Signed   By: Narda RutherfordMelanie  Sanford M.D.   On: 02/07/2019 22:18   Dg Foot Complete Left  Result Date: 02/08/2019 CLINICAL DATA:  Postop recent toe amputation.  Fever. EXAM: LEFT FOOT - COMPLETE 3+ VIEW COMPARISON:  None. FINDINGS: Changes of amputation  of the left 2nd and 3rd toes at the level of the distal portion of the proximal phalanx. Fracture noted at the base of the left great toe distal phalanx. No radiographic changes of osteomyelitis. IMPRESSION: Fracture the base of the left great toe distal phalanx. Prior amputation of the 2nd and 3rd toes. Electronically Signed   By: Charlett NoseKevin  Dover M.D.   On: 02/08/2019 00:45    ROS Blood pressure 113/72, pulse 98, temperature 98.9 F (37.2 C), temperature source Oral, resp. rate 18, height 5\' 11"  (1.803 m), weight 117.4 kg, SpO2 99 %. Physical Exam By the report of the emergency room physician the wound looked good.  There is no  erythema or drainage.  There was no unanticipated soft tissue swelling. Assessment/Plan: 23 year old male 5 days status post amputation of second and third toes for sequela of crush injury who presents to the emergency room with tachycardia, hypotension,and concerns for sepsis.  Wound showed no signs of this being the source of sepsis.  He ultimately turned out to have corona virus which is likely the source of his vital signs changes.//given the overall picture I believe we will be available should anything change in terms of his lower extremity picture.  I will leave decisions of anticoagulation up to his team.  There are no restrictions to his ability to bear weight based on his lower extremity surgery but he should use a hard soled shoe when up and I would continue to keep the wound Covered at this point..  We are available at any time at the number below should situation change,but feel this virtual consult is appropriate given the current situation.  Aleja Yearwood Starling MannsL Dilyn Smiles 02/08/2019, 7:01 AM  [phone]

## 2019-02-08 NOTE — ED Notes (Signed)
ED TO INPATIENT HANDOFF REPORT  ED Nurse Name and Phone #: [phone]  S Name/Age/Gender Douglas Reyes 23 y.o. male Room/Bed: 026C/026C  Code Status Full code  Home/SNF/Other Dc home AO x 4   Triage Complete: Triage complete  Chief Complaint fever. post surgery. amputation 2 toes (left foot)  Triage Note Pt states that last Thursday he had two of his toe amputated on his L foot, today he started to run a fever of 101 yesterday, pt also reports cough since yesterday,  abd pain and headache.    Allergies No Known Allergies  Level of Care/Admitting Diagnosis ED Disposition    ED Disposition Condition Comment   Admit  The patient appears reasonably stabilized for admission considering the current resources, flow, and capabilities available in the ED at this time, and I doubt any other [facility] requiring further screening and/or treatment in the ED prior to admission is  present.       B Medical/Surgery History Past Medical History:  Diagnosis Date  . Asthma    as a child, no problems as an adult, no inhaler   Past Surgical History:  Procedure Laterality Date  . AMPUTATION TOE Left 02/04/2019   Procedure: AMPUTATION LEFT 2ND AND 3RD TOE;  Surgeon: Erle Crocker, MD;  Location: [place];  Service: Orthopedics;  Laterality: Left;  . CLOSED REDUCTION METATARSAL Left 02/04/2019   Procedure: CLOSED REDUCTION OF HALLUX FRACTURE;  Surgeon: Erle Crocker, MD;  Location: [place];  Service: Orthopedics;  Laterality: Left;     A IV Location/Drains/Wounds Patient Lines/Drains/Airways Status   Active Line/Drains/Airways    Name:   Placement date:   Placement time:   Site:   Days:   Peripheral IV 02/08/19 Left Antecubital   02/08/19    0009    Antecubital   less than 1   Peripheral IV 02/08/19 Right Antecubital   02/08/19    0010    Antecubital   less than 1   Airway   02/04/19    -     4   Incision (Closed) 02/04/19 Chest Left   02/04/19    1455     4   Incision  (Closed) 02/04/19 Foot Left   02/04/19    1539     4          Intake/Output Last 24 hours No intake or output data in the 24 hours ending 02/08/19 0135  Labs/Imaging Results for orders placed or performed during the hospital encounter of 02/07/19 (from the past 48 hour(s))  Lactic acid, plasma     Status: None   Collection Time: 02/07/19  9:44 PM  Result Value Ref Range   Lactic Acid, Venous 1.4 0.5 - 1.9 mmol/L    Comment: Performed at [facility], [address], [place], [place] [address]  Comprehensive metabolic panel     Status: Abnormal   Collection Time: 02/07/19  9:44 PM  Result Value Ref Range   Sodium 136 135 - 145 mmol/L   Potassium 4.1 3.5 - 5.1 mmol/L   Chloride 104 98 - 111 mmol/L   CO2 22 22 - 32 mmol/L   Glucose, Bld 113 (H) 70 - 99 mg/dL   BUN 11 6 - 20 mg/dL   Creatinine, Ser 0.96 0.61 - 1.24 mg/dL   Calcium 9.2 8.9 - 10.3 mg/dL   Total Protein 7.3 6.5 - 8.1 g/dL   Albumin 4.3 3.5 - 5.0 g/dL   AST 54 (H) 15 -  41 U/L   ALT 132 (H) 0 - 44 U/L   Alkaline Phosphatase 49 38 - 126 U/L   Total Bilirubin 0.6 0.3 - 1.2 mg/dL   GFR calc non Af Amer >60 >60 mL/min   GFR calc Af Amer >60 >60 mL/min   Anion gap 10 5 - 15    Comment: Performed at [facility], [address] [place], [place] [address]  CBC with Differential     Status: Abnormal   Collection Time: 02/07/19  9:44 PM  Result Value Ref Range   WBC 7.6 4.0 - 10.5 K/uL   RBC 5.85 (H) 4.22 - 5.81 MIL/uL   Hemoglobin 16.4 13.0 - 17.0 g/dL   HCT 60.448.4 54.039.0 - 98.152.0 %   MCV 82.7 80.0 - 100.0 fL   MCH 28.0 26.0 - 34.0 pg   MCHC 33.9 30.0 - 36.0 g/dL   RDW 19.112.3 47.811.5 - 29.515.5 %   Platelets 188 150 - 400 K/uL   nRBC 0.0 0.0 - 0.2 %   Neutrophils Relative % 64 %   Neutro Abs 4.9 1.7 - 7.7 K/uL   Lymphocytes Relative 14 %   Lymphs Abs 1.0 0.7 - 4.0 K/uL   Monocytes Relative 17 %   Monocytes Absolute 1.3 (H) 0.1 - 1.0 K/uL   Eosinophils Relative 2 %   Eosinophils Absolute 0.1 0.0 - 0.5 K/uL    Basophils Relative 1 %   Basophils Absolute 0.1 0.0 - 0.1 K/uL   Immature Granulocytes 2 %   Abs Immature Granulocytes 0.15 (H) 0.00 - 0.07 K/uL    Comment: Performed at [facility], [address] [place], [place] [address]  SARS Coronavirus 2     Status: Abnormal   Collection Time: 02/07/19 11:59 PM  Result Value Ref Range   SARS Coronavirus 2 DETECTED (A) NOT DETECTED    Comment: RESULT CALLED TO, READ BACK BY AND VERIFIED WITH: RN Y LEBRONE @0129  02/08/19 BY S GEZAHEGN (NOTE) SARS-CoV-2 target nucleic acids are DETECTED. The SARS-CoV-2 RNA is generally detectable in upper and lower respiratory specimens during the acute phase of infection. Positive results are indicative of active infection with SARS-CoV-2. Clinical  correlation with patient history and other diagnostic information is necessary to determine patient infection status. Positive results do  not rule out bacterial infection or co-infection with other viruses. The expected result is Not Detected. Fact Sheet for Patients: http://www.biofiredefense.com/wp-content/uploads/2020/03/BIOFIRE-COVID -19-patients.pdf Fact Sheet for Healthcare Providers: http://www.biofiredefense.com/wp-content/uploads/2020/03/BIOFIRE-COVID -19-hcp.pdf This test is not yet approved or cleared by the [place] FDA and  has been authorized for detection and/or diagnosis of SARS-CoV-2 by FDA under an Emergen cy Use Authorization (EUA).  This EUA will remain in effect (meaning this test can be used) for the duration of  the COVID-19 declaration under Section 564(b)(1) of the Act, 21 U.S.C. section [phone] 3(b)(1), unless the authorization is terminated or revoked sooner. Performed at [facility], [address] [place], [place] [address]   Lactic acid, plasma     Status: None   Collection Time: 02/08/19 12:03 AM  Result Value Ref Range   Lactic Acid, Venous 1.2 0.5 - 1.9 mmol/L    Comment: Performed at [facility], [address] [place], [place] [address]  Urinalysis, Routine w reflex microscopic     Status: Abnormal   Collection Time: 02/08/19 12:40 AM  Result Value Ref Range   Color, Urine AMBER (A) YELLOW    Comment: BIOCHEMICALS MAY BE  AFFECTED BY COLOR   APPearance CLEAR CLEAR   Specific Gravity, Urine 1.029 1.005 - 1.030   pH 5.0 5.0 - 8.0   Glucose, UA NEGATIVE NEGATIVE mg/dL   Hgb urine dipstick NEGATIVE NEGATIVE   Bilirubin Urine NEGATIVE NEGATIVE   Ketones, ur NEGATIVE NEGATIVE mg/dL   Protein, ur NEGATIVE NEGATIVE mg/dL   Nitrite NEGATIVE NEGATIVE   Leukocytes,Ua NEGATIVE NEGATIVE    Comment: Performed at [facility], [address] [place], [place] [address]   Dg Chest 2 View  Result Date: 02/07/2019 CLINICAL DATA:  Cough and fever today. EXAM: CHEST - 2 VIEW COMPARISON:  None. FINDINGS: The cardiomediastinal contours are normal. The lungs are clear. Pulmonary vasculature is normal. No consolidation, pleural effusion, or pneumothorax. No acute osseous abnormalities are seen. IMPRESSION: Unremarkable radiographs of the chest. Electronically Signed   By: Narda RutherfordMelanie  Sanford M.D.   On: 02/07/2019 22:18   Dg Foot Complete Left  Result Date: 02/08/2019 CLINICAL DATA:  Postop recent toe amputation.  Fever. EXAM: LEFT FOOT - COMPLETE 3+ VIEW COMPARISON:  None. FINDINGS: Changes of amputation of the left 2nd and 3rd toes at the level of the distal portion of the proximal phalanx. Fracture noted at the base of the left great toe distal phalanx. No radiographic changes of osteomyelitis. IMPRESSION: Fracture the base of the left great toe distal phalanx. Prior amputation of the 2nd and 3rd toes. Electronically Signed   By: Charlett NoseKevin  Dover M.D.   On: 02/08/2019 00:45    Pending Labs Unresulted Labs (From admission, onward)    Start     Ordered   02/08/19 0057  Comprehensive metabolic panel  ONCE - STAT,   STAT     02/08/19 0057   02/07/19 2335  Blood culture (routine x 2)  BLOOD CULTURE X 2,    STAT     02/07/19 2334          Vitals/Pain Today's Vitals   02/07/19 2132 02/07/19 2133 02/07/19 2336  BP:  125/82 116/80  Pulse:  (!) 138 67  Resp:  18 18  Temp:  (!) 100.5 F (38.1 C) (!) 101.1 F (38.4 C)  TempSrc:  Oral Oral  SpO2:  94% 97%  PainSc: 7       Isolation Precautions Droplet and Contact precautions  Medications Medications  sodium chloride flush (NS) 0.9 % injection 3 mL (3 mLs Intravenous Not Given 02/08/19 0007)  ceFEPIme (MAXIPIME) 2 g in sodium chloride 0.9 % 100 mL IVPB (2 g Intravenous New Bag/Given 02/08/19 0118)  metroNIDAZOLE (FLAGYL) IVPB 500 mg (500 mg Intravenous New Bag/Given 02/08/19 0126)  vancomycin (VANCOCIN) 2,500 mg in sodium chloride 0.9 % 500 mL IVPB (has no administration in time range)  acetaminophen (TYLENOL) tablet 1,000 mg (1,000 mg Oral Given 02/08/19 0039)    Mobility One assistance Low fall risk   Focused Assessments Status post op amputation of 2 toes left foot last Thursday, having increase fever. Positive for Covid.   R Recommendations: See Admitting Provider Note  Report given to:   Additional Notes:

## 2019-02-08 NOTE — Discharge Summary (Signed)
Discharge Summary  Douglas Reyes BJY:[id number] DOB: 01-09-1996  PCP: Patient, No Pcp Per  Admit date: 02/07/2019 Discharge date: 02/08/2019  Time spent: 35 minutes   Recommendations for Outpatient Follow-up:  1. Follow-up with orthopedic surgery, please keep your scheduled appointment. 2. Please quarantine and return to the ED if you develop respiratory or GI problems. 3. Abstain from tobacco use.   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    Discharge Condition: Stable  Diet recommendation: Resume previous diet  Vitals:   02/08/19 0307 02/08/19 0755  BP: 113/72 138/76  Pulse: 98 100  Resp:  18  Temp: 98.9 F (37.2 C) 98.5 F (36.9 C)  SpO2: 99% 98%    History of present illness:  Douglas KeyOswaldo Reyes Mercadois a 23 y.o.malewith medical history significant oftobacco abuse, childhood asthma,who presents with cough and fever. Recent L foot surgery POD# 4. No new findings on L foot xray. No radiographic signs of osteomyelitis. Discussed with ortho Dr. Luiz BlareGraves who will continue to follow outpatient. Keep scheduled appointment.  02/08/19: Patient seen and examined at his bedside.  He denies any shortness of breath, chest pain, nausea, abdominal pain, diarrhea.  Admits to mild intermittent dry cough.  Independently reviewed chest x-ray done at admission which showed no active cardiopulmonary disease.  O2 saturation 99% on room air.  Physical exam essentially unremarkable exam essentially unremarkable except for left lower extremity post surgical intervention.  LDH, troponin, d-dimer, fibrinogen, ferritin, sed rate within normal range.  CRP mildly elevated 1.0.  Patient denies any cardiopulmonary symptoms.   We will discharge to home.  Patient has been instructed to quarentine, to return to the ED if he becomes symptomatic.  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 Tested for covid 19 on 02/04/19 which was negative. Tested again on 02/07/19 which was positive.  Negative CXR, independently viewed which showed clear lungs with no active cardiopulmonary disease.   O2 sat 99% RA Denies dyspnea Intermittent mild nonproductive cough Instructed to quarantine and to return to the ED if develops respiratory or GI issues. LDH, troponin, d-dimer, fibrinogen, ferritin, sed rate within normal range.  CRP mildly elevated 1.0.   Follow-up with PCP DME pulse oximeter at discharge  Tobacco abuse: -Bedside counseling about importance of quitting smoking -Nicotine patch -Abstain from tobacco use  Amputation of toe of left foot and closed treatment of left hallux distal phalanx fracture: Patient is on Keflex.  Patient states that he needed 2 more days of Keflex for total of 5 days. -continue home keflex  -prn oxycodone for pain -f/u Bx -Discussed with Dr. Luiz BlareGraves -Follow-up with orthopedic surgery outpatient, keep your appointment.   Code Status: Full code  Consults called:  Ortho, Dr. Luiz BlareGraves team     Discharge Exam: BP 138/76 (BP Location: Left Arm)    Pulse 100    Temp 98.5 F (36.9 C) (Oral)    Resp 18    Ht 5\' 11"  (1.803 m)    Wt 117.4 kg  SpO2 98%    BMI 36.10 kg/m   General: 23 y.o. year-old male well developed well nourished in no acute distress.  Alert and oriented x3.  Cardiovascular: Regular rate and rhythm with no rubs or gallops.  No thyromegaly or JVD noted.    Respiratory: Clear to auscultation with no wheezes or rales. Good inspiratory  effort.  Abdomen: Soft nontender nondistended with normal bowel sounds x4 quadrants.  Musculoskeletal: Left foot is wrapped in surgical dressing.  Psychiatry: Mood is appropriate for condition and setting  Discharge Instructions You were cared for by a hospitalist during your hospital stay. If you have any questions about your discharge medications or the care you received while you were in the hospital after you are discharged, you can call the unit and asked to speak with the hospitalist on call if the hospitalist that took care of you is not available. Once you are discharged, your primary care physician will handle any further medical issues. Please note that NO REFILLS for any discharge medications will be authorized once you are discharged, as it is imperative that you return to your primary care physician (or establish a relationship with a primary care physician if you do not have one) for your aftercare needs so that they can reassess your need for medications and monitor your lab values.   Allergies as of 02/08/2019   No Known Allergies     Medication List    TAKE these medications   cephALEXin 500 MG capsule Commonly known as: KEFLEX Take 1 capsule (500 mg total) by mouth 4 (four) times daily for 5 days.   nicotine 21 mg/24hr patch Commonly known as: NICODERM CQ - dosed in mg/24 hours Place 1 patch (21 mg total) onto the skin daily. Start taking on: February 09, 2019   oxyCODONE 5 MG immediate release tablet Commonly known as: Roxicodone Take 1 tablet (5 mg total) by mouth every 4 (four) hours as needed for up to 7 days.            Durable Medical Equipment  (From admission, onward)         Start     Ordered   02/08/19 1210  For home use only DME Pulse oximeter  Once     02/08/19 1209         No Known Allergies Follow-up Information    Dorna Leitz, MD. Call in 1 day(s).   Specialty: Orthopedic Surgery Why: please keep your appointment and call to confirm.  Thanks. Contact information: [place] [address] [phone]        [place]. Call in 1 day(s).   Why: please call for a post hospital follow up appointment. Contact information: [address] [place] [address] [phone]           The results of significant diagnostics from this hospitalization (including imaging, microbiology, ancillary and laboratory) are listed below for reference.    Significant Diagnostic Studies: Dg Chest 2 View  Result Date: 02/07/2019 CLINICAL DATA:  Cough and fever today. EXAM: CHEST - 2 VIEW COMPARISON:  None. FINDINGS: The cardiomediastinal contours are normal. The lungs are clear. Pulmonary vasculature is normal. No consolidation, pleural effusion, or pneumothorax. No acute osseous abnormalities are seen. IMPRESSION: Unremarkable radiographs of the chest. Electronically Signed   By: Keith Rake M.D.   On: 02/07/2019 22:18   Dg Foot Complete Left  Result Date: 02/08/2019 CLINICAL DATA:  Postop recent toe amputation.  Fever. EXAM: LEFT FOOT - COMPLETE 3+ VIEW COMPARISON:  None. FINDINGS: Changes of amputation of the left 2nd and 3rd toes at the level of the distal portion of the proximal phalanx. Fracture noted at the base of the left great toe distal phalanx. No radiographic changes of osteomyelitis. IMPRESSION: Fracture the base of the left great toe distal phalanx. Prior amputation of the 2nd and 3rd toes. Electronically Signed   By: Charlett NoseKevin  Dover M.D.   On: 02/08/2019 00:45    Microbiology: Recent Results (from the past 240 hour(s))  SARS Coronavirus 2     Status: None   Collection Time: 02/04/19 12:28 PM  Result Value Ref Range Status   SARS Coronavirus 2 NOT DETECTED NOT DETECTED Final    Comment: (NOTE) SARS-CoV-2 target nucleic acids are NOT DETECTED. The SARS-CoV-2 RNA is generally detectable in upper and lower respiratory specimens during the acute phase of  infection.  Negative  results do not preclude SARS-CoV-2 infection, do not rule out co-infections with other pathogens, and should not be used as the sole basis for treatment or other patient management decisions.  Negative results must be combined with clinical observations, patient history, and epidemiological information. The expected result is Not Detected. Fact Sheet for Patients: http://www.biofiredefense.com/wp-content/uploads/2020/03/BIOFIRE-COVID -19-patients.pdf Fact Sheet for Healthcare Providers: http://www.biofiredefense.com/wp-content/uploads/2020/03/BIOFIRE-COVID -19-hcp.pdf This test is not yet approved or cleared by the [place] FDA and  has been authorized for detection and/or diagnosis of SARS-CoV-2 by FDA under an Emergency Use Authorization (EUA).  This EUA will remain in effec t (meaning this test can be used) for the duration of  the COVID-19 declaration under Section 564(b)(1) of the Act, 21 U.S.C. section 360bbb-3(b)(1), unless the authorization is terminated or revoked sooner. Performed at [facility], [address] [place], [place] [address]   SARS Coronavirus 2     Status: Abnormal   Collection Time: 02/07/19 11:59 PM  Result Value Ref Range Status   SARS Coronavirus 2 DETECTED (A) NOT DETECTED Final    Comment: RESULT CALLED TO, READ BACK BY AND VERIFIED WITH: RN Y LEBRONE @0129  02/08/19 BY S GEZAHEGN (NOTE) SARS-CoV-2 target nucleic acids are DETECTED. The SARS-CoV-2 RNA is generally detectable in upper and lower respiratory specimens during the acute phase of infection. Positive results are indicative of active infection with SARS-CoV-2. Clinical  correlation with patient history and other diagnostic information is necessary to determine patient infection status. Positive results do  not rule out bacterial infection or co-infection with other viruses. The expected result is Not Detected. Fact Sheet for  Patients: http://www.biofiredefense.com/wp-content/uploads/2020/03/BIOFIRE-COVID -19-patients.pdf Fact Sheet for Healthcare Providers: http://www.biofiredefense.com/wp-content/uploads/2020/03/BIOFIRE-COVID -19-hcp.pdf This test is not yet approved or cleared by the [place] FDA and  has been authorized for detection and/or diagnosis of SARS-CoV-2 by FDA under an Emergen cy Use Authorization (EUA).  This EUA will remain in effect (meaning this test can be used) for the duration of  the COVID-19 declaration under Section 564(b)(1) of the Act, 21 U.S.C. section [phone] 3(b)(1), unless the authorization is terminated or revoked sooner. Performed at [facility], [address] [place], [place] [address]      Labs: Basic Metabolic Panel: Recent Labs  Lab 02/07/19 2144 02/08/19 0317  NA 136 134*  K 4.1 3.9  CL 104 103  CO2 22 21*  GLUCOSE 113* 112*  BUN 11 14  CREATININE 0.96 1.05  CALCIUM 9.2 8.9   Liver Function Tests: Recent Labs  Lab 02/07/19 2144 02/08/19 0317  AST 54*  47*  ALT 132* 119*  ALKPHOS 49 45  BILITOT 0.6 0.7  PROT 7.3 6.7  ALBUMIN 4.3 4.0   No results for input(s): LIPASE, AMYLASE in the last 168 hours. No results for input(s): AMMONIA in the last 168 hours. CBC: Recent Labs  Lab 02/04/19 1323 02/07/19 2144  WBC  --  7.6  NEUTROABS  --  4.9  HGB 15.4 16.4  HCT  --  48.4  MCV  --  82.7  PLT  --  188   Cardiac Enzymes: Recent Labs  Lab 02/08/19 0502  TROPONINI <0.03   BNP: BNP (last 3 results) Recent Labs    02/08/19 0502  BNP 5.3    ProBNP (last 3 results) No results for input(s): PROBNP in the last 8760 hours.  CBG: No results for input(s): GLUCAP in the last 168 hours.     Signed:  Darlin Droparole N Emmaleigh Longo, MD [facility] 02/08/2019, 12:33 PM

## 2019-02-08 NOTE — H&P (Addendum)
History and Physical    Douglas Reyes MWU:[id number] DOB: 08-May-1996 DOA: 02/07/2019  Referring MD/NP/PA:   PCP: Patient, No Pcp Per   Patient coming from:  The patient is coming from home.  At baseline, pt is independent for most of ADL.        Chief Complaint: fever and cough  HPI: Douglas Reyes is a 23 y.o. male with medical history significant of tobacco abuse, childhood asthma, who presents with cough and fever  Pt underwent procedure, including left second toe partial amputation, left third toe partial amputation and closed treatment of left hallux distal phalanx fracture by Dr. Lucia Gaskins on 6/11. He states that his foot pain has improved, but started having fever and chill yesterday. He also has cough with little mucus production and a minimal shortness of breath.  No chest pain.  Patient denies nausea vomiting, diarrhea, abdominal pain, symptoms of UTI or unilateral weakness.  ED Course: pt was found to have positive COVID-19 test, WBC 7.6, lactic acid of 1.2, electrolytes renal function okay, liver function (ALP 49, AST 54, ALT 132, total bilirubin 0.6), temperature one 1.1, tachycardia with HR 138-->70s, oxygen saturation most of the time 94 to 97% on room air, RR 18, chest x-ray negative. X-ray of left foot did not showe new acute issues. EDP consulted ortho, Dr. Berenice Primas team will see the patient in consult. Due to ortho consultation, pt is admitted to [facility] hospital.   X-ray of le# ft foot showed: 1. Fracture the base of the left great toe distal phalanx. 2. Prior amputation of the 2nd and 3rd toes.   Review of Systems:   General: has fevers, chills, no body weight gain, has fatigue HEENT: no blurry vision, hearing changes or sore throat Respiratory: no dyspnea, coughing, wheezing CV: no chest pain, no palpitations GI: no nausea, vomiting, abdominal pain, diarrhea, constipation GU: no dysuria, burning on urination, increased urinary frequency, hematuria    Ext: no leg edema Neuro: no unilateral weakness, numbness, or tingling, no vision change or hearing loss Skin: no rash, no skin tear. MSK: No muscle spasm, no deformity, no limitation of range of movement in spin. S/p of left second toe partial amputation, left third toe partial amputation and closed treatment of left hallux distal phalanx fracture. Heme: No easy bruising.  Travel history: No recent long distant travel.  Allergy: No Known Allergies  Past Medical History:  Diagnosis Date   Asthma    as a child, no problems as an adult, no inhaler    Past Surgical History:  Procedure Laterality Date   AMPUTATION TOE Left 02/04/2019   Procedure: AMPUTATION LEFT 2ND AND 3RD TOE;  Surgeon: Erle Crocker, MD;  Location: [facility];  Service: Orthopedics;  Laterality: Left;   CLOSED REDUCTION METATARSAL Left 02/04/2019   Procedure: CLOSED REDUCTION OF HALLUX FRACTURE;  Surgeon: Erle Crocker, MD;  Location: [facility];  Service: Orthopedics;  Laterality: Left;    Social History:  reports that he has been smoking cigarettes. He has a 0.30 pack-year smoking history. He has never used smokeless tobacco. He reports current alcohol use of about 8.0 standard drinks of alcohol per week. He reports that he does not use drugs.  Family History: Reviewed with pt, patient states that all family members are healthy.  Prior to Admission medications   Medication Sig Start Date End Date Taking? Authorizing Provider  cephALEXin (KEFLEX) 500 MG capsule Take 1 capsule (500 mg total) by mouth 4 (four) times daily for  5 days. 02/04/19 02/09/19  Terance HartAdair, Christopher R, MD  oxyCODONE (ROXICODONE) 5 MG immediate release tablet Take 1 tablet (5 mg total) by mouth every 4 (four) hours as needed for up to 7 days. 02/04/19 02/11/19  Terance HartAdair, Christopher R, MD    Physical Exam: Vitals:   02/08/19 0200 02/08/19 0215 02/08/19 0230 02/08/19 0307  BP: 125/71 126/73 119/75 113/72  Pulse: (!) 118 (!) 114 (!) 108 98   Resp:      Temp:    98.9 F (37.2 C)  TempSrc:    Oral  SpO2: 97% 98% 96% 99%  Weight:    117.4 kg  Height:    5\' 11"  (1.803 m)   General: Not in acute distress HEENT:       Eyes: PERRL, EOMI, no scleral icterus.       ENT: No discharge from the ears and nose, no pharynx injection, no tonsillar enlargement.        Neck: No JVD, no bruit, no mass felt. Heme: No neck lymph node enlargement. Cardiac: S1/S2, RRR, No murmurs, No gallops or rubs. Respiratory: No rales, wheezing, rhonchi or rubs. GI: Soft, nondistended, nontender, no rebound pain, no organomegaly, BS present. GU: No hematuria Ext: No pitting leg edema bilaterally. 2+DP/PT pulse bilaterally. Musculoskeletal: S/p of left second toe partial amputation, left third toe partial amputation and closed treatment of left hallux distal phalanx fracture. Has little dry blood and with tenderness. Skin: No rashes.  Neuro: Alert, oriented X3, cranial nerves II-XII grossly intact, moves all extremities normally.  Psych: Patient is not psychotic, no suicidal or hemocidal ideation.  Labs on Admission: I have personally reviewed following labs and imaging studies  CBC: Recent Labs  Lab 02/04/19 1323 02/07/19 2144  WBC  --  7.6  NEUTROABS  --  4.9  HGB 15.4 16.4  HCT  --  48.4  MCV  --  82.7  PLT  --  188   Basic Metabolic Panel: Recent Labs  Lab 02/07/19 2144  NA 136  K 4.1  CL 104  CO2 22  GLUCOSE 113*  BUN 11  CREATININE 0.96  CALCIUM 9.2   GFR: Estimated Creatinine Clearance: 158.6 mL/min (by C-G formula based on SCr of 0.96 mg/dL). Liver Function Tests: Recent Labs  Lab 02/07/19 2144  AST 54*  ALT 132*  ALKPHOS 49  BILITOT 0.6  PROT 7.3  ALBUMIN 4.3   No results for input(s): LIPASE, AMYLASE in the last 168 hours. No results for input(s): AMMONIA in the last 168 hours. Coagulation Profile: No results for input(s): INR, PROTIME in the last 168 hours. Cardiac Enzymes: No results for input(s): CKTOTAL,  CKMB, CKMBINDEX, TROPONINI in the last 168 hours. BNP (last 3 results) No results for input(s): PROBNP in the last 8760 hours. HbA1C: No results for input(s): HGBA1C in the last 72 hours. CBG: No results for input(s): GLUCAP in the last 168 hours. Lipid Profile: No results for input(s): CHOL, HDL, LDLCALC, TRIG, CHOLHDL, LDLDIRECT in the last 72 hours. Thyroid Function Tests: No results for input(s): TSH, T4TOTAL, FREET4, T3FREE, THYROIDAB in the last 72 hours. Anemia Panel: No results for input(s): VITAMINB12, FOLATE, FERRITIN, TIBC, IRON, RETICCTPCT in the last 72 hours. Urine analysis:    Component Value Date/Time   COLORURINE AMBER (A) 02/08/2019 0040   APPEARANCEUR CLEAR 02/08/2019 0040   LABSPEC 1.029 02/08/2019 0040   PHURINE 5.0 02/08/2019 0040   GLUCOSEU NEGATIVE 02/08/2019 0040   HGBUR NEGATIVE 02/08/2019 0040   BILIRUBINUR NEGATIVE 02/08/2019  0040   KETONESUR NEGATIVE 02/08/2019 0040   PROTEINUR NEGATIVE 02/08/2019 0040   NITRITE NEGATIVE 02/08/2019 0040   LEUKOCYTESUR NEGATIVE 02/08/2019 0040   Sepsis Labs: @LABRCNTIP (procalcitonin:4,lacticidven:4) ) Recent Results (from the past 240 hour(s))  SARS Coronavirus 2     Status: None   Collection Time: 02/04/19 12:28 PM  Result Value Ref Range Status   SARS Coronavirus 2 NOT DETECTED NOT DETECTED Final    Comment: (NOTE) SARS-CoV-2 target nucleic acids are NOT DETECTED. The SARS-CoV-2 RNA is generally detectable in upper and lower respiratory specimens during the acute phase of infection.  Negative  results do not preclude SARS-CoV-2 infection, do not rule out co-infections with other pathogens, and should not be used as the sole basis for treatment or other patient management decisions.  Negative results must be combined with clinical observations, patient history, and epidemiological information. The expected result is Not Detected. Fact Sheet for  Patients: http://www.biofiredefense.com/wp-content/uploads/2020/03/BIOFIRE-COVID -19-patients.pdf Fact Sheet for Healthcare Providers: http://www.biofiredefense.com/wp-content/uploads/2020/03/BIOFIRE-COVID -19-hcp.pdf This test is not yet approved or cleared by the [place] FDA and  has been authorized for detection and/or diagnosis of SARS-CoV-2 by FDA under an Emergency Use Authorization (EUA).  This EUA will remain in effec t (meaning this test can be used) for the duration of  the COVID-19 declaration under Section 564(b)(1) of the Act, 21 U.S.C. section 360bbb-3(b)(1), unless the authorization is terminated or revoked sooner. Performed at [facility], [address] [place], [place] [address]   SARS Coronavirus 2     Status: Abnormal   Collection Time: 02/07/19 11:59 PM  Result Value Ref Range Status   SARS Coronavirus 2 DETECTED (A) NOT DETECTED Final    Comment: RESULT CALLED TO, READ BACK BY AND VERIFIED WITH: RN Y LEBRONE @0129  02/08/19 BY S GEZAHEGN (NOTE) SARS-CoV-2 target nucleic acids are DETECTED. The SARS-CoV-2 RNA is generally detectable in upper and lower respiratory specimens during the acute phase of infection. Positive results are indicative of active infection with SARS-CoV-2. Clinical  correlation with patient history and other diagnostic information is necessary to determine patient infection status. Positive results do  not rule out bacterial infection or co-infection with other viruses. The expected result is Not Detected. Fact Sheet for Patients: http://www.biofiredefense.com/wp-content/uploads/2020/03/BIOFIRE-COVID -19-patients.pdf Fact Sheet for Healthcare Providers: http://www.biofiredefense.com/wp-content/uploads/2020/03/BIOFIRE-COVID -19-hcp.pdf This test is not yet approved or cleared by the [place] FDA and  has been authorized for detection and/or diagnosis of SARS-CoV-2 by FDA under an Emergen cy Use Authorization (EUA).   This EUA will remain in effect (meaning this test can be used) for the duration of  the COVID-19 declaration under Section 564(b)(1) of the Act, 21 U.S.C. section [phone] [id number] 3(b)(1), unless the authorization is terminated or revoked sooner. Performed at [facility], [address] [place], [place] [address]      Radiological Exams on Admission: Dg Chest 2 View  Result Date: 02/07/2019 CLINICAL DATA:  Cough and fever today. EXAM: CHEST - 2 VIEW COMPARISON:  None. FINDINGS: The cardiomediastinal contours are normal. The lungs are clear. Pulmonary vasculature is normal. No consolidation, pleural effusion, or pneumothorax. No acute osseous abnormalities are seen. IMPRESSION: Unremarkable radiographs of the chest. Electronically Signed   By: Narda RutherfordMelanie  Sanford M.D.   On: 02/07/2019 22:18   Dg Foot Complete Left  Result Date: 02/08/2019 CLINICAL DATA:  Postop recent toe amputation.  Fever. EXAM: LEFT FOOT - COMPLETE 3+ VIEW COMPARISON:  None. FINDINGS: Changes of amputation of the left 2nd and 3rd toes at the level  of the distal portion of the proximal phalanx. Fracture noted at the base of the left great toe distal phalanx. No radiographic changes of osteomyelitis. IMPRESSION: Fracture the base of the left great toe distal phalanx. Prior amputation of the 2nd and 3rd toes. Electronically Signed   By: Charlett NoseKevin  Dover M.D.   On: 02/08/2019 00:45     EKG: Independently reviewed.  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  Patient meets criteria for sepsis with fever and tachycardia.  Oxygen saturation 94-97 on RA most of the time.  He has cough with minimal shortness of breath.  Will hold off Remdesivir and Toci now.  F/u biomarker.  Patient received 1 dose of vancomycin, cefepime and Flagyl in ED.  No leukocytosis.  Will hold antibiotics.  -will place on tele bed for obs -Atrovent inhaler, PRN Xopenex inhaler -PRN Mucinex for cough -Follow-up flu PCR and RVP -f/u Blood culture -No IV fluid now. Will attempt to maintain euvolemia to a net negative fluid status -D-dimer, BNP,Trop, LFT, CRP, LDH, Procalcitonin, IL-6, ferritin, fibinogen, TG, Hep B SAg, HIV ab -Daily CRP, Ferritin, D-dimer, -if develops oxygen desaturation<94%, will ask the patient to maintain an awake prone position for 16+ hours a day, if possible, with a minimum of 2-3 hours at a time -Patient was seen wearing full PPE including: gown, gloves, head cover, N95, and face shield; donning and doffing was observed to be in compliance with current standards. -IF patient deteriorates, will consult PCCM and ID -f/u Bx  Tobacco abuse: -Did counseling about importance of quitting smoking -Nicotine patch  Amputation of toe of left foot and closed treatment of left hallux distal phalanx fracture: Patient is on Keflex.  Patient states that he needed 2 more days of Keflex for total of 5 days. -continue home keflex  -prn oxycodone for pain -f/u Bx -wound care consult -f/u ortho Recommendations  DVT ppx: SQ Lovenox Code Status: Full code Family Communication: None at bed side.       Disposition Plan:  Anticipate discharge back to previous home environment Consults called:  Ortho, Dr. Luiz BlareGraves team Admission status: Obs / tele    Date of Service 02/08/2019    Lorretta HarpXilin Tariya Morrissette [facility]   If 7PM-7AM, please contact night-coverage [email or web address] Password [email or web address] 02/08/2019, 3:56 AM

## 2019-02-08 NOTE — TOC Transition Note (Addendum)
Transition of Care [facility]) - CM/SW Discharge Note   Patient Details  Name: Douglas Reyes MRN: [id number] Date of Birth: May 10, 1996  Transition of [facility]) CM/SW Contact:  Zenon Mayo, RN Phone Number: 02/08/2019, 1:14 PM   Clinical Narrative:    Patient for dc home today, MD has ordered pulse oximetery, patient states he does not need, but MD states he does.  NCM called Eula Listen - workers comp RN at [phone] and left message for a return call back for pulse oximetery.  Also he does not have a PCP and will need apt set up at the [facility] clinic when they open back up today after lunch.  NCM received call back from [facility], she states since this was a covid admit and not related to his surgery , workes comp will not cover, his insurance will have to cover.  NCM called [place] Lumbar , HR states he has insurance with [employer] number is [id number], [place] [id number], customer service [phone].  RX BIN [id number] pharmacy help desk is [phone].  NCM contacted Zack with [employer] for the pulse oximetery, he will bring information to patient room prior to dc for the pulse ox.   Final next level of care: Home/Self Care Barriers to Discharge: No Barriers Identified   Patient Goals and CMS Choice Patient states their goals for this hospitalization and ongoing recovery are:: go home get better   Choice offered to / list presented to : NA  Discharge Placement                       Discharge Plan and Services                DME Arranged: (pulse oximetery) DME Agency: (workers comp) Date DME Agency Contacted: 02/08/19(left message for [employer] workers comp rep) Time DME Agency Contacted: [phone] Representative spoke with at DME Agency: [address]- left message HH Arranged: NA          Social Determinants of Health (SDOH) Interventions     Readmission Risk Interventions No flowsheet data found.

## 2019-02-08 NOTE — ED Provider Notes (Signed)
[facility] Provider [facility]   CSN: [id number] Arrival date & time: 02/07/19  2125     History   Chief Complaint Chief Complaint  Patient presents with  . Post-op Problem    HPI Douglas CornersOswaldo Castaneda Reyes is a 23 y.o. male.     The history is provided by the patient.  Fever Max temp prior to arrival:  101 Temp source:  Oral Severity:  Moderate Onset quality:  Gradual Duration:  1 day Timing:  Constant Progression:  Unchanged Chronicity:  New Relieved by:  Nothing Worsened by:  Nothing Ineffective treatments:  None tried Associated symptoms: cough   Associated symptoms: no chest pain, no confusion, no congestion, no diarrhea, no dysuria, no myalgias, no rash, no rhinorrhea, no somnolence, no sore throat and no vomiting   Risk factors comment:  Surgery on 6/11 Patient who is POD 4 with 2nd and 3rd toe amputation on the left foot presents with fever and cough.  Negative covid just prior to surgery.  Denies urinary symptoms.     Past Medical History:  Diagnosis Date  . Asthma    as a child, no problems as an adult, no inhaler    There are no active problems to display for this patient.   Past Surgical History:  Procedure Laterality Date  . AMPUTATION TOE Left 02/04/2019   Procedure: AMPUTATION LEFT 2ND AND 3RD TOE;  Surgeon: Terance HartAdair, Christopher R, MD;  [facility];  Service: Orthopedics;  Laterality: Left;  . CLOSED REDUCTION METATARSAL Left 02/04/2019   Procedure: CLOSED REDUCTION OF HALLUX FRACTURE;  Surgeon: Terance HartAdair, Christopher R, MD;  [facility];  Service: Orthopedics;  Laterality: Left;        Home Medications    Prior to Admission medications   Medication Sig Start Date End Date Taking? Authorizing Provider  cephALEXin (KEFLEX) 500 MG capsule Take 1 capsule (500 mg total) by mouth 4 (four) times daily for 5 days. 02/04/19 02/09/19  Terance HartAdair, Christopher R, MD  oxyCODONE (ROXICODONE) 5 MG immediate release tablet Take  1 tablet (5 mg total) by mouth every 4 (four) hours as needed for up to 7 days. 02/04/19 02/11/19  Terance HartAdair, Christopher R, MD    Family History No family history on file.  Social History Social History   Tobacco Use  . Smoking status: Current Every Day Smoker    Packs/day: 0.10    Years: 3.00    Pack years: 0.30    Types: Cigarettes  . Smokeless tobacco: Never Used  . Tobacco comment: on weekends only  Substance Use Topics  . Alcohol use: Yes    Alcohol/week: 8.0 standard drinks    Types: 8 Cans of beer per week  . Drug use: Never     Allergies   Patient has no known allergies.   Review of Systems Review of Systems  Constitutional: Positive for fever.  HENT: Negative for congestion, rhinorrhea and sore throat.   Respiratory: Positive for cough. Negative for shortness of breath and wheezing.   Cardiovascular: Negative for chest pain, palpitations and leg swelling.  Gastrointestinal: Negative for abdominal pain, diarrhea and vomiting.  Genitourinary: Negative for dysuria.  Musculoskeletal: Negative for joint swelling and myalgias.  Skin: Negative for rash.  Psychiatric/Behavioral: Negative for confusion.  All other systems reviewed and are negative.    Physical Exam Updated Vital Signs BP 116/80   Pulse 67   Temp (!) 101.1 F (38.4 C) (Oral)   Resp 18   SpO2 97%  Physical Exam Vitals signs and nursing note reviewed.  Constitutional:      Appearance: He is obese. He is not ill-appearing.  HENT:     Head: Normocephalic and atraumatic.     Nose: Nose normal.  Eyes:     Conjunctiva/sclera: Conjunctivae normal.     Pupils: Pupils are equal, round, and reactive to light.  Neck:     Musculoskeletal: Normal range of motion and neck supple.  Cardiovascular:     Rate and Rhythm: Regular rhythm. Tachycardia present.     Pulses: Normal pulses.     Heart sounds: Normal heart sounds.  Pulmonary:     Effort: Pulmonary effort is normal.     Breath sounds: Normal  breath sounds.  Abdominal:     General: Abdomen is flat. Bowel sounds are normal.     Tenderness: There is no abdominal tenderness. There is no guarding.  Musculoskeletal: Normal range of motion.  Skin:    General: Skin is warm and dry.     Capillary Refill: Capillary refill takes less than 2 seconds.     Comments: Incisions CDI  Neurological:     General: No focal deficit present.     Mental Status: He is alert and oriented to person, place, and time.  Psychiatric:        Mood and Affect: Mood normal.        Behavior: Behavior normal.      ED Treatments / Results  Labs (all labs ordered are listed, but only abnormal results are displayed) Results for orders placed or performed during the hospital encounter of 02/07/19  Lactic acid, plasma  Result Value Ref Range   Lactic Acid, Venous 1.4 0.5 - 1.9 mmol/L  Lactic acid, plasma  Result Value Ref Range   Lactic Acid, Venous 1.2 0.5 - 1.9 mmol/L  Comprehensive metabolic panel  Result Value Ref Range   Sodium 136 135 - 145 mmol/L   Potassium 4.1 3.5 - 5.1 mmol/L   Chloride 104 98 - 111 mmol/L   CO2 22 22 - 32 mmol/L   Glucose, Bld 113 (H) 70 - 99 mg/dL   BUN 11 6 - 20 mg/dL   Creatinine, Ser 1.610.96 0.61 - 1.24 mg/dL   Calcium 9.2 8.9 - 09.610.3 mg/dL   Total Protein 7.3 6.5 - 8.1 g/dL   Albumin 4.3 3.5 - 5.0 g/dL   AST 54 (H) 15 - 41 U/L   ALT 132 (H) 0 - 44 U/L   Alkaline Phosphatase 49 38 - 126 U/L   Total Bilirubin 0.6 0.3 - 1.2 mg/dL   GFR calc non Af Amer >60 >60 mL/min   GFR calc Af Amer >60 >60 mL/min   Anion gap 10 5 - 15  CBC with Differential  Result Value Ref Range   WBC 7.6 4.0 - 10.5 K/uL   RBC 5.85 (H) 4.22 - 5.81 MIL/uL   Hemoglobin 16.4 13.0 - 17.0 g/dL   HCT 04.548.4 40.939.0 - 81.152.0 %   MCV 82.7 80.0 - 100.0 fL   MCH 28.0 26.0 - 34.0 pg   MCHC 33.9 30.0 - 36.0 g/dL   RDW 91.412.3 78.211.5 - 95.615.5 %   Platelets 188 150 - 400 K/uL   nRBC 0.0 0.0 - 0.2 %   Neutrophils Relative % 64 %   Neutro Abs 4.9 1.7 - 7.7 K/uL    Lymphocytes Relative 14 %   Lymphs Abs 1.0 0.7 - 4.0 K/uL   Monocytes Relative 17 %  Monocytes Absolute 1.3 (H) 0.1 - 1.0 K/uL   Eosinophils Relative 2 %   Eosinophils Absolute 0.1 0.0 - 0.5 K/uL   Basophils Relative 1 %   Basophils Absolute 0.1 0.0 - 0.1 K/uL   Immature Granulocytes 2 %   Abs Immature Granulocytes 0.15 (H) 0.00 - 0.07 K/uL   Dg Chest 2 View  Result Date: 02/07/2019 CLINICAL DATA:  Cough and fever today. EXAM: CHEST - 2 VIEW COMPARISON:  None. FINDINGS: The cardiomediastinal contours are normal. The lungs are clear. Pulmonary vasculature is normal. No consolidation, pleural effusion, or pneumothorax. No acute osseous abnormalities are seen. IMPRESSION: Unremarkable radiographs of the chest. Electronically Signed   By: Keith Rake M.D.   On: 02/07/2019 22:18    EKG EKG Interpretation  Date/Time:  Sunday February 07 2019 21:46:27 EDT Ventricular Rate:  152 PR Interval:  122 QRS Duration: 72 QT Interval:  262 QTC Calculation: 416 R Axis:   105 Text Interpretation:  Sinus tachycardia Rightward axis Confirmed by Dory Horn) on 02/07/2019 11:43:54 PM   Radiology Dg Chest 2 View  Result Date: 02/07/2019 CLINICAL DATA:  Cough and fever today. EXAM: CHEST - 2 VIEW COMPARISON:  None. FINDINGS: The cardiomediastinal contours are normal. The lungs are clear. Pulmonary vasculature is normal. No consolidation, pleural effusion, or pneumothorax. No acute osseous abnormalities are seen. IMPRESSION: Unremarkable radiographs of the chest. Electronically Signed   By: Keith Rake M.D.   On: 02/07/2019 22:18    Procedures Procedures (including critical care time)  Medications Ordered in ED Medications  sodium chloride flush (NS) 0.9 % injection 3 mL (3 mLs Intravenous Not Given 02/08/19 0007)  ceFEPIme (MAXIPIME) 2 g in sodium chloride 0.9 % 100 mL IVPB (has no administration in time range)  metroNIDAZOLE (FLAGYL) IVPB 500 mg (has no administration in time range)   vancomycin (VANCOCIN) 2,500 mg in sodium chloride 0.9 % 500 mL IVPB (has no administration in time range)  acetaminophen (TYLENOL) tablet 1,000 mg (1,000 mg Oral Given 02/08/19 0039)   MDM Reviewed: previous chart, nursing note and vitals Reviewed previous: labs Interpretation: labs, ECG and x-ray (no infection in foot or cxr by me, normal lactate.  ) Total time providing critical care: 30-74 minutes. This excludes time spent performing separately reportable procedures and services. Consults: admitting MD and orthopedics  CRITICAL CARE Performed by: Dominick Morella K Dyllan Hughett-Rasch Total critical care time: 31 minutes Critical care time was exclusive of separately billable procedures and treating other patients. Critical care was necessary to treat or prevent imminent or life-threatening deterioration. Critical care was time spent personally by me on the following activities: development of treatment plan with patient and/or surrogate as well as nursing, discussions with consultants, evaluation of patient's response to treatment, examination of patient, obtaining history from patient or surrogate, ordering and performing treatments and interventions, ordering and review of laboratory studies, ordering and review of radiographic studies, pulse oximetry and re-evaluation of patient's condition.  Case d/w Dr. Berenice Primas the team will see the patient in consult.   Final Clinical Impressions(s) / ED Diagnoses   Final diagnoses:  SIRS (systemic inflammatory response syndrome) ([place])    Admit to medicine for Fransico Meadow, Connie Hilgert, MD 02/08/19 0139

## 2019-02-09 LAB — INTERLEUKIN-6, PLASMA: Interleukin-6, Plasma: 12.5 pg/mL — ABNORMAL HIGH (ref 0.0–12.2)

## 2019-02-09 LAB — HEPATITIS B SURFACE ANTIGEN: Hepatitis B Surface Ag: NEGATIVE

## 2019-02-13 LAB — CULTURE, BLOOD (ROUTINE X 2)
Culture: NO GROWTH
Culture: NO GROWTH
Special Requests: ADEQUATE
Special Requests: ADEQUATE

## 2019-02-15 NOTE — Progress Notes (Signed)
Patient ID: Douglas Reyes, Douglas Reyes   DOB: [birth year], 23 y.o.   MRN: [id number] Virtual Visit via Telephone Note  I connected with Douglas Reyes on 02/16/19 at 11:00 AM EDT by telephone and verified that I am speaking with the correct person using two identifiers.   Consent:  I discussed the limitations, risks, security and privacy concerns of performing an evaluation and management service by telephone and the availability of in person appointments. I also discussed with the patient that there may be a patient responsible charge related to this service. The patient expressed understanding and agreed to proceed.  Location of patient: The patient was at home  Location of provider: I was in my office  Persons participating in the televisit with the patient.   No one else on the call but the patient    History of Present Illness:  This is a 23 year old Douglas Reyes who was hospitalized from the 14th and 15 June for COVID-19 tracheobronchitis.  The patient's chest x-ray actually was negative for any infiltrate.  Patient previously had had surgery on his right foot with toe amputation after a crush injury at work to the foot.  The surgery occurred on June 11 and at that point his COVID test was negative preop.  The patient then developed symptoms of cough and fever and came back to the emergency room on June 14 and COVID was positive and he was admitted overnight for observation.  The patient did rapidly improve and he did not require intubation.  He subsequently has been discharged and states his symptoms now completely resolved.  He is having no fever.  He is having no shortness of breath or cough.  He is having no diarrhea.  He has full sensation of taste and smell.  There are no body aches.  There is no headaches or dizziness.  He has an upcoming orthopedic appointment to follow-up on his right foot which is doing well.  Going back to when his symptoms began the patient's had at least 11  days since symptom onset and has had 3+ days of no fever. Below is the discharge summary Admit date: 02/07/2019 Discharge date: 02/08/2019  Time spent: 35 minutes   Recommendations for Outpatient Follow-up:  1. Follow-up with orthopedic surgery, please keep your scheduled appointment. 2. Please quarantine and return to the ED if you develop respiratory or GI problems. 3. Abstain from tobacco use.   Discharge Diagnoses:      Active Hospital Problems   Diagnosis Date Noted  . Acute respiratory disease due to COVID-19 virus 02/08/2019  . Sepsis ([place]) 02/08/2019  . Tobacco abuse 02/08/2019  . 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    Discharge Condition: Stable  Diet recommendation: Resume previous diet      Vitals:   02/08/19 0307 02/08/19 0755  BP: 113/72 138/76  Pulse: 98 100  Resp:  18  Temp: 98.9 F (37.2 C) 98.5 F (36.9 C)  SpO2: 99% 98%    History of present illness:  Douglas Bowden Mercadois a 23 y.o.malewith medical history significant oftobacco abuse, childhood asthma,who presents with cough and fever. Recent L foot surgery POD# 4. No new findings on L foot xray. No radiographic signs of osteomyelitis. Discussed with ortho Dr. Berenice Primas who will continue to follow outpatient. Keep scheduled appointment.  02/08/19: Patient seen and examined at his bedside.  He denies any shortness of breath, chest pain, nausea,  abdominal pain, diarrhea.  Admits to mild intermittent dry cough.  Independently reviewed chest x-ray done at admission which showed no active cardiopulmonary disease.  O2 saturation 99% on room air.  Physical exam essentially unremarkable exam essentially unremarkable except for left lower extremity post surgical intervention.  LDH, troponin, d-dimer, fibrinogen, ferritin, sed rate within normal range.  CRP mildly elevated 1.0.  Patient denies any cardiopulmonary  symptoms.  We will discharge to home.  Patient has been instructed to quarentine, to return to the ED if he becomes symptomatic.  Hospital Course:  Principal Problem:   Acute respiratory disease due to COVID-19 virus Active Problems:   Sepsis ([facility])   Tobacco abuse   Amputation of toe of left foot ([facility])  COVID-19 viral infection: Presented with a fever and mild intermittent nonproductive cough. Tested for covid 19 on 02/04/19 which was negative. Tested again on 02/07/19 which was positive.  Negative CXR, independently viewed which showed clear lungs with no active cardiopulmonary disease.   O2 sat 99% RA Denies dyspnea Intermittent mild nonproductive cough Instructed to quarantine and to return to the ED if develops respiratory or GI issues. LDH, troponin, d-dimer, fibrinogen, ferritin, sed rate within normal range.  CRP mildly elevated 1.0.   Follow-up with PCP DME pulse oximeter at discharge  Tobacco abuse: -Bedside counseling about importance of quitting smoking -Nicotine patch -Abstain from tobacco use  Amputation of toe of left footand closed treatment of left hallux distal phalanx fracture:Patient is on Keflex. Patient states that he needed 2 more days of Keflex for total of 5 days. -continue home keflex  -prn oxycodone for pain -f/u Bx -Discussed with Dr. Luiz BlareGraves -Follow-up with orthopedic surgery outpatient, keep your appointment. Constitutional:   No  weight loss, night sweats,  Fevers, chills, fatigue, lassitude. HEENT:   No headaches,  Difficulty swallowing,  Tooth/dental problems,  Sore throat,                No sneezing, itching, ear ache, nasal congestion, post nasal drip,   CV:  No chest pain,  Orthopnea, PND, swelling in lower extremities, anasarca, dizziness, palpitations  GI  No heartburn, indigestion, abdominal pain, nausea, vomiting, diarrhea, change in bowel habits, loss of appetite  Resp: No shortness of breath with exertion or at rest.  No excess  mucus, no productive cough,  No non-productive cough,  No coughing up of blood.  No change in color of mucus.  No wheezing.  No chest wall deformity  Skin: no rash or lesions.  GU: no dysuria, change in color of urine, no urgency or frequency.  No flank pain.  MS:  No joint pain or swelling.  No decreased range of motion.  No back pain.  Psych:  No change in mood or affect. No depression or anxiety.  No memory loss.    Observations/Objective: No observations as this was a telephone visit  All labs in epic were reviewed  Chest x-ray showed no active process  Assessment and Plan: #1 COVID-19 tracheobronchitis with acute hypoxemia now totally resolved  From my perspective the patient can be released from isolation and does not require further follow-up.  The patient does know if his symptoms recur he should go back to the emergency room for evaluation  #2 left foot fracture with partial toe amputations  The patient's had a close reduction of the fracture of the foot and he has a follow-up scheduled with orthopedics there were no outstanding issues in this regard  #3 tobacco use  The patient had been vaping and I recommended complete abstinence from tobacco products and vaping I spent at least 5 minutes onto smoking cessation counseling   Follow Up Instructions:    I discussed the assessment and treatment plan with the patient. The patient was provided an opportunity to ask questions and all were answered. The patient agreed with the plan and demonstrated an understanding of the instructions.   The patient was advised to call back or seek an in-person evaluation if the symptoms worsen or if the condition fails to improve as anticipated.  I provided 20minutes of non-face-to-face time during this encounter  including  median intraservice time , review of notes, labs, imaging, medications  and explaining diagnosis and management to the patient .    Shan LevansPatrick Tyneshia Stivers, MD

## 2019-02-16 ENCOUNTER — Other Ambulatory Visit: Payer: Self-pay

## 2019-02-16 ENCOUNTER — Ambulatory Visit: Payer: Self-pay | Attending: Critical Care Medicine | Admitting: Critical Care Medicine

## 2019-02-16 ENCOUNTER — Encounter: Payer: Self-pay | Admitting: Critical Care Medicine

## 2019-02-16 DIAGNOSIS — J069 Acute upper respiratory infection, unspecified: Secondary | ICD-10-CM

## 2019-02-16 DIAGNOSIS — A419 Sepsis, unspecified organism: Secondary | ICD-10-CM | POA: Diagnosis not present

## 2019-02-16 DIAGNOSIS — Z720 Tobacco use: Secondary | ICD-10-CM

## 2019-02-16 DIAGNOSIS — U071 COVID-19: Secondary | ICD-10-CM | POA: Diagnosis not present

## 2019-02-16 NOTE — Progress Notes (Signed)
Spoke with patient and he stated he is feeling better.

## 2019-11-19 IMAGING — CR CHEST - 2 VIEW
2 series · 2 of 2 positions shown · non-contrast
Comparison: None.

CLINICAL DATA: Cough and fever today.

EXAM:
CHEST - 2 VIEW

[chest pa]
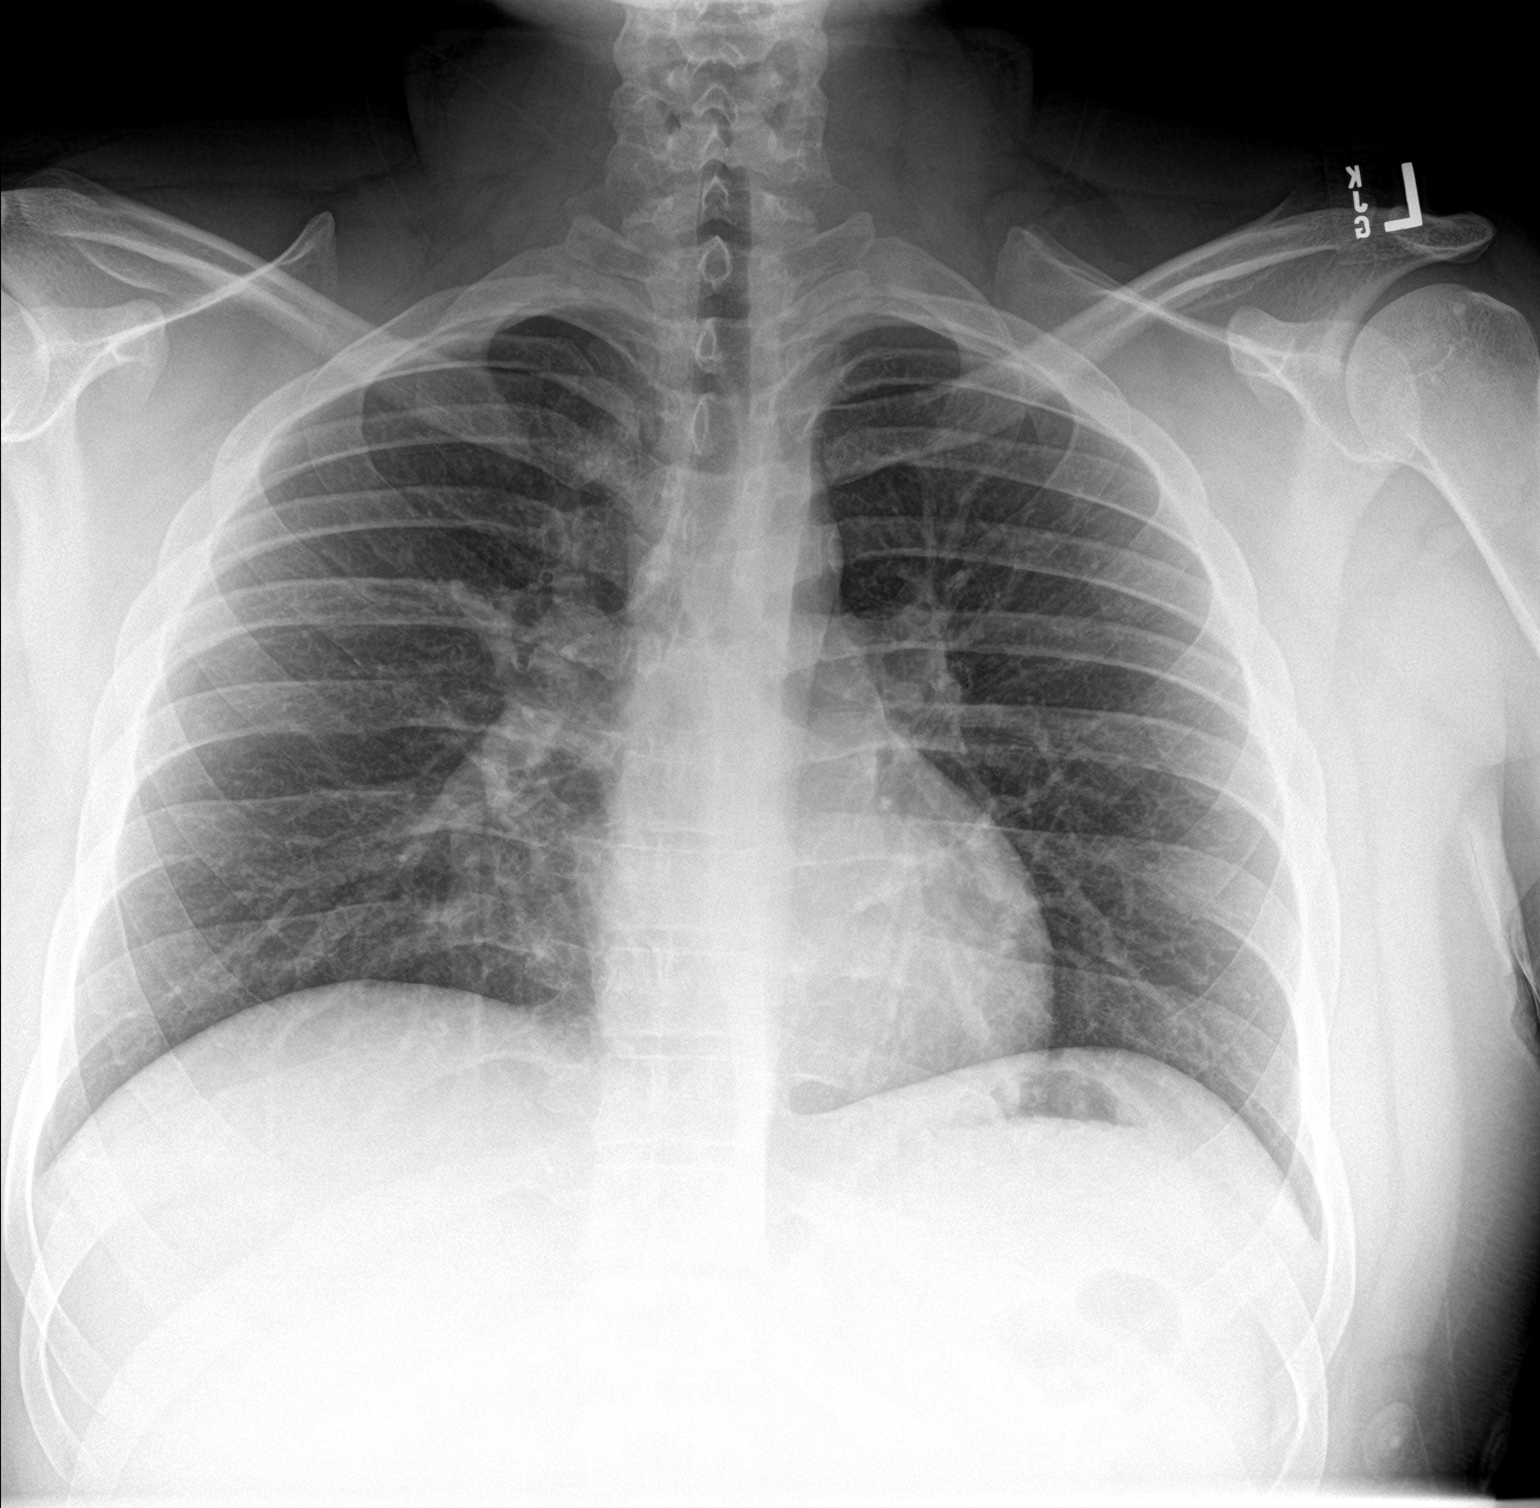

[chest lat]
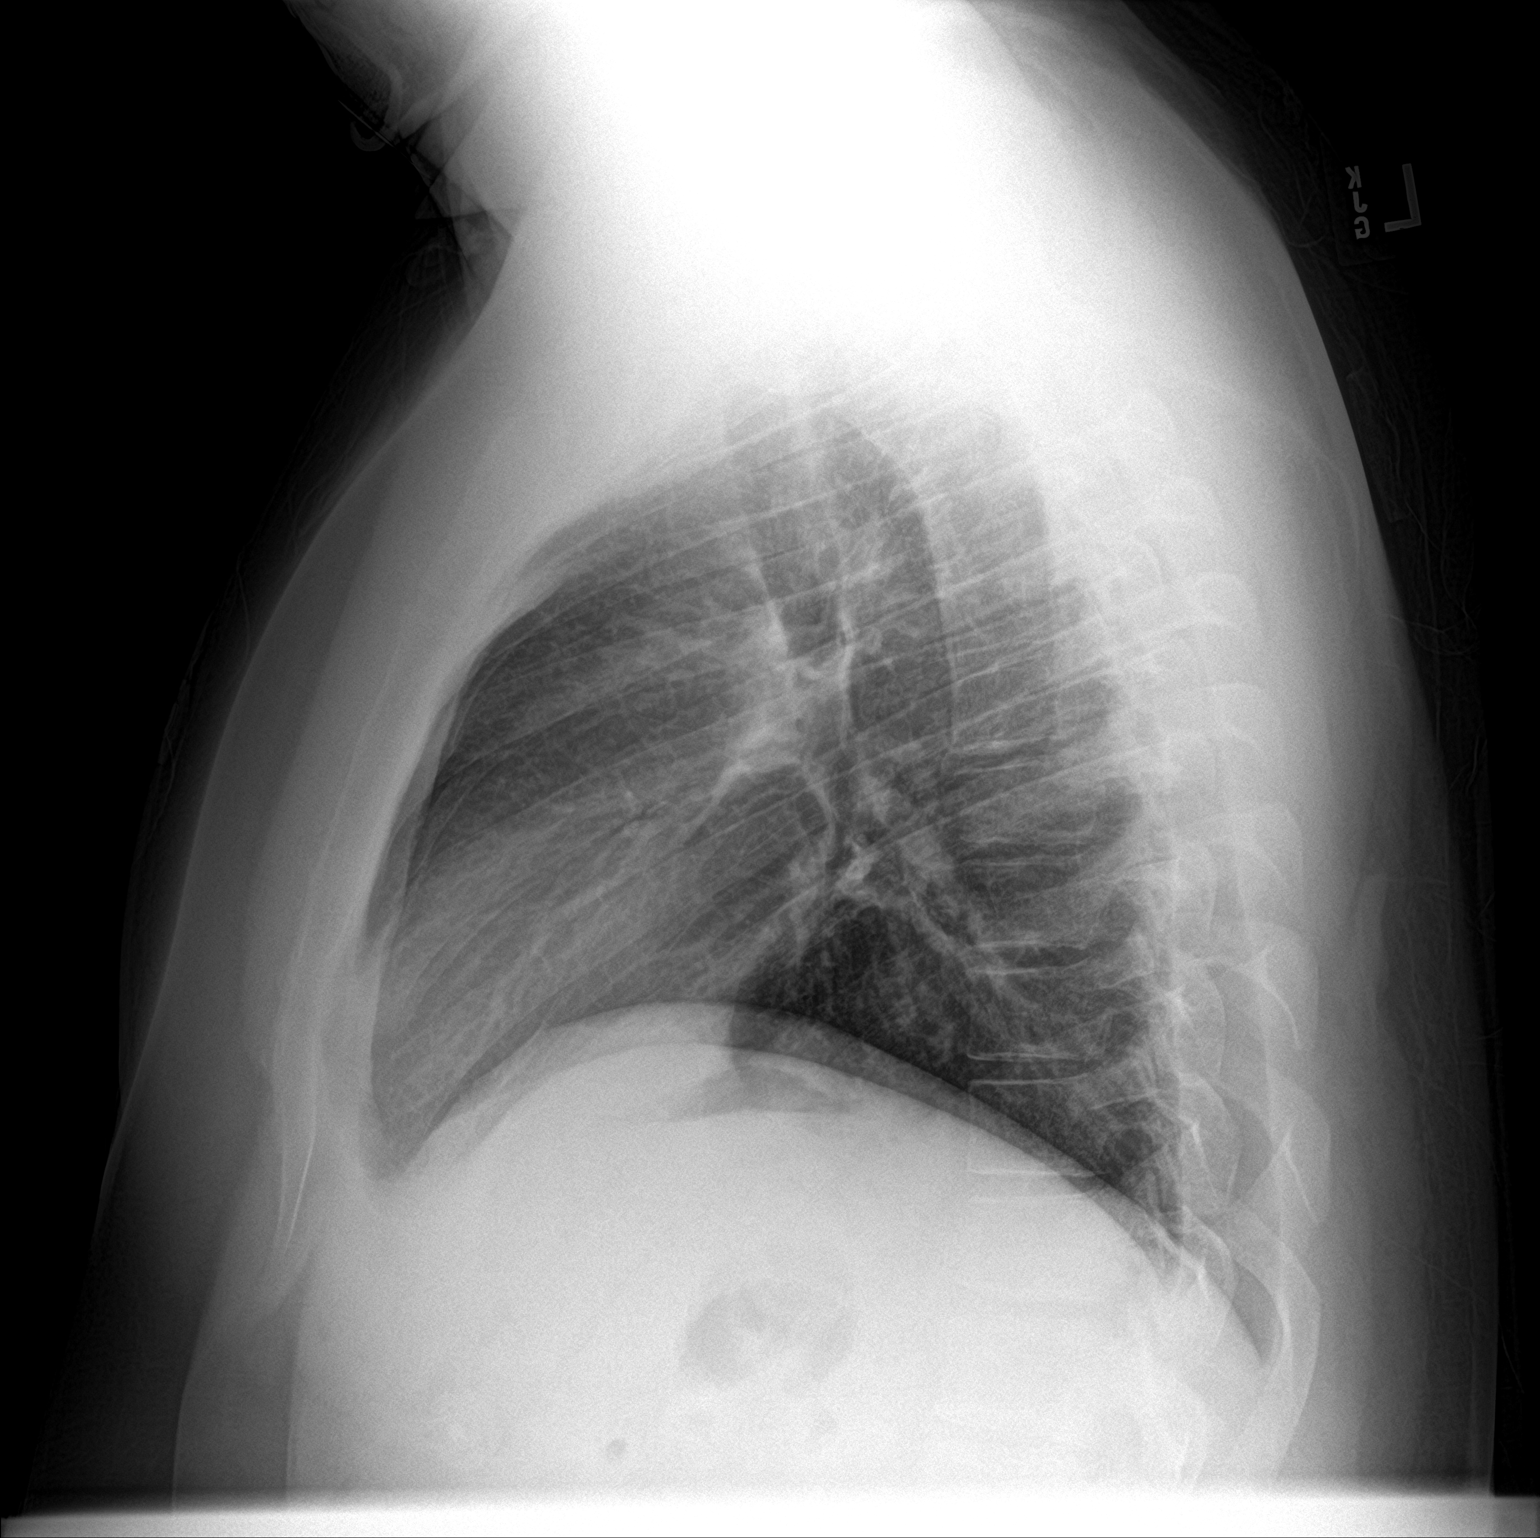

[2 of 2 positions shown; findings below may reference images not displayed]

FINDINGS: The cardiomediastinal contours are normal. The lungs are clear.
Pulmonary vasculature is normal. No consolidation, pleural effusion,
or pneumothorax. No acute osseous abnormalities are seen.
IMPRESSION: Unremarkable radiographs of the chest.

## 2023-09-16 ENCOUNTER — Ambulatory Visit: Payer: Self-pay

## 2023-09-17 ENCOUNTER — Ambulatory Visit: Payer: Self-pay

## 2023-09-22 ENCOUNTER — Ambulatory Visit (INDEPENDENT_AMBULATORY_CARE_PROVIDER_SITE_OTHER): Payer: Commercial Managed Care - PPO

## 2023-09-22 VITALS — BP 124/78 | HR 80 | Temp 97.0°F | Ht 68.75 in | Wt 272.0 lb

## 2023-09-22 DIAGNOSIS — Z0000 Encounter for general adult medical examination without abnormal findings: Secondary | ICD-10-CM | POA: Diagnosis not present

## 2023-09-22 DIAGNOSIS — Z131 Encounter for screening for diabetes mellitus: Secondary | ICD-10-CM | POA: Diagnosis not present

## 2023-09-22 DIAGNOSIS — Z13220 Encounter for screening for lipoid disorders: Secondary | ICD-10-CM | POA: Diagnosis not present

## 2023-09-22 NOTE — Progress Notes (Signed)
New Patient Office Visit  Subjective    Patient ID: Treyce Spillers, male    DOB: 04/18/1996  Age: 28 y.o. MRN: [id number]  CC:  Chief Complaint  Patient presents with   New to establish    HPI Estevon Fluke presents to establish care and a WELL ADULT EXAM Hali Marry, a previously healthy individual, presents for a routine check-up due to a family history of diabetes and high cholesterol. He has not had a primary care provider for some time and is unsure of his vaccination status. He works in [employer]er, [employer]ellery and has been generally healthy, with a past history of asthma, but no recent attacks.  In 2020, he contracted COVID-19, which was not severe. 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 He was bedridden for six months following the accident but has since recovered well. He reports occasional pain in the amputated area during cold mornings but otherwise has no limitations and can participate in sports and weightlifting.  Hali Marry does not take any daily medications. He smokes cigarettes occasionally, usually when drinking alcohol. He has been sober for a month and is trying to avoid smoking as well. When he does drink, it is usually socially, consuming three to four beers.  He has no vision problems and has regular dental care. He exercises almost daily, primarily weightlifting. He has no pain, sleeps well, and denies any symptoms of anxiety or depression. He has noticed increased thirst and frequent urination, which he attributes to heavy water intake during the day. He has no other health concerns at this time.  Reports family history of diabetes No family history of cancer   No outpatient encounter medications on file as of 09/22/2023.   No facility-administered encounter medications on file as of 09/22/2023.    Past Medical History:  Diagnosis Date   Acute respiratory  disease due to COVID-19 virus 02/08/2019   Asthma    as a child, no problems as an adult, no inhaler   Sepsis (HCC) 02/08/2019    Past Surgical History:  Procedure Laterality Date   AMPUTATION TOE Left 02/04/2019   Procedure: AMPUTATION LEFT 2ND AND 3RD TOE;  Surgeon: Terance Hart, MD;  Location: [facility] OR;  Service: Orthopedics;  Laterality: Left;   CLOSED REDUCTION METATARSAL Left 02/04/2019   Procedure: CLOSED REDUCTION OF HALLUX FRACTURE;  Surgeon: Terance Hart, MD;  Location: [facility] OR;  Service: Orthopedics;  Laterality: Left;    Family History  Problem Relation Age of Onset   Diabetes Mother    Hyperlipidemia Father    Breast cancer Paternal Aunt     Social History   Socioeconomic History   Marital status: Single    Spouse name: Not on file   Number of children: Not on file   Years of education: Not on file   Highest education level: Not on file  Occupational History   Not on file  Tobacco Use   Smoking status: Every Day    Current packs/day: 0.10    Average packs/day: 0.1 packs/day for 3.0 years (0.3 ttl pk-yrs)    Types: Cigarettes   Smokeless tobacco: Never   Tobacco comments:    on weekends only  Vaping Use   Vaping status: Some Days   Start date: 08/26/2017   Substances: Nicotine, Flavoring   Devices: Juul  Substance and Sexual Activity   Alcohol use: Yes    Alcohol/week:  8.0 standard drinks of alcohol    Types: 8 Cans of beer per week   Drug use: Never   Sexual activity: Not on file  Other Topics Concern   Not on file  Social History Narrative   Not on file   Social Drivers of Health   Financial Resource Strain: Not on file  Food Insecurity: Not on file  Transportation Needs: Not on file  Physical Activity: Not on file  Stress: Not on file  Social Connections: Not on file  Intimate Partner Violence: Not on file    Review of Systems  Constitutional:  Negative for fever and malaise/fatigue.  HENT:  Negative for congestion, ear pain and  sore throat.   Respiratory:  Negative for cough and shortness of breath.   Cardiovascular:  Negative for chest pain and leg swelling.  Gastrointestinal:  Negative for abdominal pain, constipation and diarrhea.  Genitourinary:  Negative for dysuria.  Musculoskeletal:  Negative for joint pain and myalgias.  Neurological:  Negative for dizziness, weakness and headaches.        Objective    BP 124/78 (BP Location: Left Arm, Patient Position: Sitting)   Pulse 80   Temp (!) 97 F (36.1 C) (Temporal)   Ht 5' 8.75" (1.746 m)   Wt 272 lb (123.4 kg)   SpO2 97%   BMI 40.46 kg/m   Physical Exam Vitals and nursing note reviewed.  Constitutional:      Appearance: Normal appearance.     Comments: Heavy built, muscular  HENT:     Head: Normocephalic and atraumatic.     Mouth/Throat:     Mouth: Mucous membranes are moist.     Pharynx: Oropharynx is clear.  Eyes:     Extraocular Movements: Extraocular movements intact.     Pupils: Pupils are equal, round, and reactive to light.  Cardiovascular:     Rate and Rhythm: Normal rate and regular rhythm.  Pulmonary:     Effort: Pulmonary effort is normal.     Breath sounds: Normal breath sounds.  Musculoskeletal:        General: Normal range of motion.     Comments: Missing 2nd and 3rd toes on left  Skin:    General: Skin is warm and dry.  Neurological:     General: No focal deficit present.     Mental Status: He is alert and oriented to person, place, and time. Mental status is at baseline.  Psychiatric:        Mood and Affect: Mood normal.        Behavior: Behavior normal.         Assessment & Plan:   Problem List Items Addressed This Visit     Well adult exam - Primary   Routine Health Checkup Routine health checkup motivated by family history of diabetes and hypercholesterolemia. Asymptomatic with no current health issues. History of asthma, currently inactive. Past medical history includes traumatic amputation of the  second and third toes on the left foot. 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   Discussed regular monitoring for diabetes and cholesterol, benefits of hemoglobin A1c and fasting cholesterol panel, and importance of cardio exercises to maintain weight and prevent diabetes. Advised on avoiding smoking and limiting alcohol consumption. - Order blood work including hemoglobin A1c, cholesterol panel, kidney and liver function tests - Encourage setting up a patient portal account for results - Advise adding cardio exercises to routine, aiming for five times  a week - Recommend avoiding smoking and limiting alcohol consumption - Schedule next wellness exam in one year  General Health Maintenance Generally healthy with regular physical activity. Family history of diabetes and hypercholesterolemia necessitates regular monitoring. Uncertain of childhood vaccination status and has not received recent vaccinations. Discussed importance of staying up to date with vaccinations, using sunscreen, and monitoring skin moles. Future screenings for prostate cancer starting at age 13-50 and colon cancer at age 56 were discussed. 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 No significant functional limitations or pain, except for some discomfort in cold weather. Continues to engage in physical activities, including weightlifting and sports. Discussed importance of proper foot care and monitoring for signs of infection or complications. - Monitor for changes in pain or function - Advise on proper foot care and monitoring for signs of infection or  complications  Follow-up - Follow up via patient portal for blood work results - Schedule next wellness exam in one year.      Screening for diabetes mellitus   Relevant Orders   Hemoglobin A1c   Screening for hyperlipidemia   Relevant Orders   Comprehensive metabolic panel   Lipid panel    No follow-ups on file.   Windell Moment, MD

## 2023-09-22 NOTE — Assessment & Plan Note (Signed)
Routine Health Checkup Routine health checkup motivated by family history of diabetes and hypercholesterolemia. Asymptomatic with no current health issues. History of asthma, currently inactive. Past medical history includes traumatic amputation of the second and third toes on the left foot. 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   Discussed regular monitoring for diabetes and cholesterol, benefits of hemoglobin A1c and fasting cholesterol panel, and importance of cardio exercises to maintain weight and prevent diabetes. Advised on avoiding smoking and limiting alcohol consumption. - Order blood work including hemoglobin A1c, cholesterol panel, kidney and liver function tests - Encourage setting up a patient portal account for results - Advise adding cardio exercises to routine, aiming for five times a week - Recommend avoiding smoking and limiting alcohol consumption - Schedule next wellness exam in one year  General Health Maintenance Generally healthy with regular physical activity. Family history of diabetes and hypercholesterolemia necessitates regular monitoring. Uncertain of childhood vaccination status and has not received recent vaccinations. Discussed importance of staying up to date with vaccinations, using sunscreen, and monitoring skin moles. Future screenings for prostate cancer starting at age 70-50 and colon cancer at age 38 were discussed. 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 No significant functional limitations or pain, except for some discomfort in cold weather. Continues to engage in physical activities,  including weightlifting and sports. Discussed importance of proper foot care and monitoring for signs of infection or complications. - Monitor for changes in pain or function - Advise on proper foot care and monitoring for signs of infection or complications  Follow-up - Follow up via patient portal for blood work results - Schedule next wellness exam in one year.

## 2023-09-23 LAB — COMPREHENSIVE METABOLIC PANEL
ALT: 76 [IU]/L — ABNORMAL HIGH (ref 0–44)
AST: 36 [IU]/L (ref 0–40)
Albumin: 4.9 g/dL (ref 4.3–5.2)
Alkaline Phosphatase: 46 [IU]/L (ref 44–121)
BUN/Creatinine Ratio: 15 (ref 9–20)
BUN: 13 mg/dL (ref 6–20)
Bilirubin Total: 0.3 mg/dL (ref 0.0–1.2)
CO2: 22 mmol/L (ref 20–29)
Calcium: 9.8 mg/dL (ref 8.7–10.2)
Chloride: 102 mmol/L (ref 96–106)
Creatinine, Ser: 0.87 mg/dL (ref 0.76–1.27)
Globulin, Total: 2.2 g/dL (ref 1.5–4.5)
Glucose: 77 mg/dL (ref 70–99)
Potassium: 4.7 mmol/L (ref 3.5–5.2)
Sodium: 141 mmol/L (ref 134–144)
Total Protein: 7.1 g/dL (ref 6.0–8.5)
eGFR: 123 mL/min/{1.73_m2} (ref >59)

## 2023-09-23 LAB — LIPID PANEL
Chol/HDL Ratio: 4.7 {ratio} (ref 0.0–5.0)
Cholesterol, Total: 183 mg/dL (ref 100–199)
HDL: 39 mg/dL — ABNORMAL LOW (ref >39)
LDL Chol Calc (NIH): 121 mg/dL — ABNORMAL HIGH (ref 0–99)
Triglycerides: 128 mg/dL (ref 0–149)
VLDL Cholesterol Cal: 23 mg/dL (ref 5–40)

## 2023-09-23 LAB — HEMOGLOBIN A1C
Est. average glucose Bld gHb Est-mCnc: 120 mg/dL
Hgb A1c MFr Bld: 5.8 % — ABNORMAL HIGH (ref 4.8–5.6)

## 2024-05-14 ENCOUNTER — Telehealth: Payer: Self-pay

## 2024-05-14 NOTE — Telephone Encounter (Signed)
 Copied from CRM [id number]. Topic: Clinical - Request for Lab/Test Order >> May 14, 2024  9:45 AM Antwanette L wrote: Reason for CRM: The patient has a six-month follow-up appointment scheduled for September 22 and is requesting to have blood work completed in advance of the visit. The patient can be contacted via MyChart or by phone at [phone].

## 2024-05-17 ENCOUNTER — Ambulatory Visit

## 2024-05-17 VITALS — BP 116/88 | HR 77 | Temp 97.9°F | Ht 68.75 in | Wt 276.3 lb

## 2024-05-17 DIAGNOSIS — R7303 Prediabetes: Secondary | ICD-10-CM | POA: Diagnosis not present

## 2024-05-17 DIAGNOSIS — E786 Lipoprotein deficiency: Secondary | ICD-10-CM | POA: Diagnosis not present

## 2024-05-17 DIAGNOSIS — Z1159 Encounter for screening for other viral diseases: Secondary | ICD-10-CM | POA: Diagnosis not present

## 2024-05-17 DIAGNOSIS — Z6841 Body Mass Index (BMI) 40.0 and over, adult: Secondary | ICD-10-CM | POA: Insufficient documentation

## 2024-05-17 DIAGNOSIS — E66813 Obesity, class 3: Secondary | ICD-10-CM | POA: Diagnosis not present

## 2024-05-17 NOTE — Assessment & Plan Note (Signed)
 Weight increased by 4 pounds since last visit. Challenges with weight loss and strategies for monitoring calorie intake and increasing physical activity discussed. - Monitor dietary intake and calorie consumption. - Encourage regular physical activity. - Consider weight management options if lifestyle modifications are insufficient.

## 2024-05-17 NOTE — Progress Notes (Signed)
 Subjective:  Patient ID: Douglas Reyes, male    DOB: 07-Aug-1996  Age: 28 y.o. MRN: [id number]  Chief Complaint  Patient presents with   Medical Management of Chronic Issues    HPI: Discussed the use of AI scribe software for clinical note transcription with the patient, who gave verbal consent to proceed.  Discussed the use of AI scribe software for clinical note transcription with the patient, who gave verbal consent to proceed.  History of Present Illness   Douglas Reyes is a 28 year old male who presents for a routine checkup and blood work.  Metabolic health and weight changes - Borderline diabetes with hemoglobin A1c of 5.8 in January - Slightly elevated liver enzymes in January - Four-pound weight gain since last visit, attributed to decreased structured physical activity due to work commitments and home maintenance - Runs three to five miles once or twice a week - Family history of diabetes and hyperlipidemia  Alcohol use - Abstinent from alcohol January to March - Currently consumes alcohol socially on Saturdays, averaging five to six drinks per occasion  Tobacco use - Significantly reduced cigarette use - No longer purchases cigarettes - Occasional smoking only when drinking alcohol  Genitourinary symptoms - Foreskin tearing during sexual activity, first episode three months ago with recurrence recently - Received topical cream for treatment at urgent care  Cardiopulmonary and sleep symptoms - No chest pain - No dyspnea - Sleeping well             05/17/2024    9:13 AM 09/22/2023    2:37 PM 02/16/2019    9:54 AM  Depression screen PHQ 2/9  Decreased Interest 0 0 0  Down, Depressed, Hopeless 0 0 0  PHQ - 2 Score 0 0 0  Altered sleeping 0  0  Tired, decreased energy 0  0  Change in appetite 0  0  Feeling bad or failure about yourself  0  0  Trouble concentrating 0  0  Moving slowly or fidgety/restless 0  0  Suicidal thoughts 0   0  PHQ-9 Score 0  0  Difficult doing work/chores Not difficult at all  Not difficult at all        05/17/2024    9:13 AM  Fall Risk   Falls in the past year? 0  Number falls in past yr: 0  Injury with Fall? 0  Risk for fall due to : No Fall Risks  Follow up Falls evaluation completed    Patient Care Team: Chrysa Rampy, MD as PCP - General (Family Medicine)   Review of Systems  Constitutional:  Negative for chills, fatigue and fever.  HENT:  Negative for congestion, ear pain, sinus pressure and sore throat.   Respiratory:  Negative for cough and shortness of breath.   Cardiovascular:  Negative for chest pain.  Gastrointestinal:  Negative for abdominal pain, constipation, diarrhea, nausea and vomiting.  Genitourinary:  Negative for dysuria and frequency.       Foreskin tear and discomfort  Musculoskeletal:  Negative for arthralgias, back pain and myalgias.  Neurological:  Negative for dizziness and headaches.  Psychiatric/Behavioral:  Negative for dysphoric mood. The patient is not nervous/anxious.     No current outpatient medications on file prior to visit.   No current facility-administered medications on file prior to visit.   Past Medical History:  Diagnosis Date   Acute respiratory disease due to COVID-19 virus 02/08/2019   Asthma    as a  child, no problems as an adult, no inhaler   Sepsis (HCC) 02/08/2019   Past Surgical History:  Procedure Laterality Date   AMPUTATION TOE Left 02/04/2019   Procedure: AMPUTATION LEFT 2ND AND 3RD TOE;  Surgeon: Elsa Lonni SAUNDERS, MD;  Location: [facility] OR;  Service: Orthopedics;  Laterality: Left;   CLOSED REDUCTION METATARSAL Left 02/04/2019   Procedure: CLOSED REDUCTION OF HALLUX FRACTURE;  Surgeon: Elsa Lonni SAUNDERS, MD;  Location: [facility] OR;  Service: Orthopedics;  Laterality: Left;    Family History  Problem Relation Age of Onset   Diabetes Mother    Hyperlipidemia Father    Breast cancer Paternal Aunt    Social History    Socioeconomic History   Marital status: Single    Spouse name: Not on file   Number of children: Not on file   Years of education: Not on file   Highest education level: Associate degree: academic program  Occupational History   Not on file  Tobacco Use   Smoking status: Every Day    Current packs/day: 0.10    Average packs/day: 0.1 packs/day for 3.0 years (0.3 ttl pk-yrs)    Types: Cigarettes   Smokeless tobacco: Never   Tobacco comments:    on weekends only  Vaping Use   Vaping status: Some Days   Start date: 08/26/2017   Substances: Nicotine , Flavoring   Devices: Juul  Substance and Sexual Activity   Alcohol use: Yes    Alcohol/week: 8.0 standard drinks of alcohol    Types: 8 Cans of beer per week   Drug use: Never   Sexual activity: Not on file  Other Topics Concern   Not on file  Social History Narrative   Not on file   Social Drivers of Health   Financial Resource Strain: Low Risk  (05/17/2024)   Overall Financial Resource Strain (CARDIA)    Difficulty of Paying Living Expenses: Not very hard  Food Insecurity: Patient Declined (05/17/2024)   Hunger Vital Sign    Worried About Running Out of Food in the Last Year: Patient declined    Ran Out of Food in the Last Year: Patient declined  Transportation Needs: No Transportation Needs (05/17/2024)   PRAPARE - Administrator, Civil Service (Medical): No    Lack of Transportation (Non-Medical): No  Physical Activity: Sufficiently Active (05/17/2024)   Exercise Vital Sign    Days of Exercise per Week: 3 days    Minutes of Exercise per Session: 60 min  Stress: No Stress Concern Present (05/17/2024)   [employer] of Occupational Health - Occupational Stress Questionnaire    Feeling of Stress: Not at all  Social Connections: Moderately Isolated (05/17/2024)   Social Connection and Isolation Panel    Frequency of Communication with Friends and Family: Three times a week    Frequency of Social Gatherings  with Friends and Family: Twice a week    Attends Religious Services: More than 4 times per year    Active Member of [employer] or Organizations: No    Attends Engineer, structural: Not on file    Marital Status: Never married    Objective:  BP 116/88   Pulse 77   Temp 97.9 F (36.6 C)   Ht 5' 8.75 (1.746 m)   Wt 276 lb 4.8 oz (125.3 kg)   SpO2 98%   BMI 41.10 kg/m      05/17/2024    9:09 AM 09/22/2023    2:40 PM 02/08/2019  7:55 AM  BP/Weight  Systolic BP 116 124 138  Diastolic BP 88 78 76  Wt. (Lbs) 276.3 272   BMI 41.1 kg/m2 40.46 kg/m2     Physical Exam Vitals and nursing note reviewed.  Constitutional:      Appearance: He is obese.  HENT:     Head: Normocephalic and atraumatic.  Cardiovascular:     Rate and Rhythm: Normal rate and regular rhythm.  Pulmonary:     Effort: Pulmonary effort is normal.     Breath sounds: Normal breath sounds.  Neurological:     General: No focal deficit present.     Mental Status: He is alert and oriented to person, place, and time.  Psychiatric:        Mood and Affect: Mood normal.        Behavior: Behavior normal.         Lab Results  Component Value Date   WBC 7.6 02/07/2019   HGB 16.4 02/07/2019   HCT 48.4 02/07/2019   PLT 188 02/07/2019   GLUCOSE 77 09/22/2023   CHOL 183 09/22/2023   TRIG 128 09/22/2023   HDL 39 (L) 09/22/2023   LDLCALC 121 (H) 09/22/2023   ALT 76 (H) 09/22/2023   AST 36 09/22/2023   NA 141 09/22/2023   K 4.7 09/22/2023   CL 102 09/22/2023   CREATININE 0.87 09/22/2023   BUN 13 09/22/2023   CO2 22 09/22/2023   INR 1.1 02/08/2019   HGBA1C 5.8 (H) 09/22/2023      Assessment & Plan:  Borderline diabetes Assessment & Plan: A1c was 5.8 in January, indicating prediabetes. No significant lifestyle changes reported since last visit. - Encourage increased physical activity. - Monitor dietary intake, focusing on reducing calorie intake if necessary.  Orders: -     Comprehensive  metabolic panel with GFR -     Lipid panel -     Hemoglobin A1c -     Hepatitis C antibody  Low HDL (under 40) Assessment & Plan: Previous labs showed low HDL and slightly elevated LDL. - Order lipid panel as part of blood work. - Encourage increased physical activity and dietary modifications.  Orders: -     Comprehensive metabolic panel with GFR -     Lipid panel -     Hemoglobin A1c -     Hepatitis C antibody  Class 3 severe obesity due to excess calories without serious comorbidity with body mass index (BMI) of 40.0 to 44.9 in adult Assessment & Plan: Weight increased by 4 pounds since last visit. Challenges with weight loss and strategies for monitoring calorie intake and increasing physical activity discussed. - Monitor dietary intake and calorie consumption. - Encourage regular physical activity. - Consider weight management options if lifestyle modifications are insufficient.  Orders: -     Comprehensive metabolic panel with GFR -     Lipid panel -     Hemoglobin A1c -     Hepatitis C antibody  Encounter for hepatitis C screening test for low risk patient -     Comprehensive metabolic panel with GFR -     Lipid panel -     Hemoglobin A1c -     Hepatitis C antibody     Body mass index is 41.1 kg/m.   Elevated liver enzymes Previous labs showed elevated liver enzymes, likely related to alcohol use. Discussed impact of alcohol on liver health. - Order liver function tests as part of blood work. - Advise limiting  alcohol intake to no more than 3 drinks per occasion.  Alcohol use Reports drinking 5-6 drinks on Saturdays, considered binge drinking. Discussed risks and advised reducing intake. - Advise limiting alcohol intake to no more than 3 drinks per occasion.  Tobacco use Significant reduction in smoking, primarily associated with alcohol consumption. - Encourage continued reduction and cessation of tobacco use.  Recurrent foreskin injury Reports recurrent  foreskin injury during sexual activity. Discussed potential benefits of circumcision. - Consider referral to urologist for circumcision if desired.          No orders of the defined types were placed in this encounter.   Orders Placed This Encounter  Procedures   Comprehensive metabolic panel with GFR   Lipid Panel   Hemoglobin A1c   Hepatitis C antibody       Follow-up: Return in about 1 year (around 05/17/2025) for wellness exam.  An After Visit Summary was printed and given to the patient.  Makiyah Zentz, MD [facility] [phone]

## 2024-05-17 NOTE — Assessment & Plan Note (Signed)
 A1c was 5.8 in January, indicating prediabetes. No significant lifestyle changes reported since last visit. - Encourage increased physical activity. - Monitor dietary intake, focusing on reducing calorie intake if necessary.

## 2024-05-17 NOTE — Patient Instructions (Signed)
  VISIT SUMMARY: Today, you had a routine checkup and blood work. We discussed your recent weight gain, family history of diabetes and hyperlipidemia, alcohol and tobacco use, and recurrent foreskin injury. We also reviewed your metabolic health and provided recommendations for managing your borderline diabetes and elevated liver enzymes.  YOUR PLAN: ADULT WELLNESS VISIT: Routine wellness visit with recent weight gain and family history of diabetes and hyperlipidemia. -Order blood work. -Review blood work results via MyChart and provide recommendations based on findings.  BORDERLINE DIABETES MELLITUS (PREDIABETES): Your A1c was 5.8 in January, indicating prediabetes. No significant lifestyle changes reported since last visit. -Encourage increased physical activity. -Monitor dietary intake, focusing on reducing calorie intake if necessary.  OBESITY: Weight increased by 4 pounds since last visit. Challenges with weight loss and strategies for monitoring calorie intake and increasing physical activity discussed. -Monitor dietary intake and calorie consumption. -Encourage regular physical activity. -Consider weight management options if lifestyle modifications are insufficient.  HYPERLIPIDEMIA: Previous labs showed low HDL and slightly elevated LDL. -Order lipid panel as part of blood work. -Encourage increased physical activity and dietary modifications.  ELEVATED LIVER ENZYMES: Previous labs showed elevated liver enzymes, likely related to alcohol use. Discussed impact of alcohol on liver health. -Order liver function tests as part of blood work. -Advise limiting alcohol intake to no more than 3 drinks per occasion.  ALCOHOL USE: Reports drinking 5-6 drinks on Saturdays, considered binge drinking. Discussed risks and advised reducing intake. -Advise limiting alcohol intake to no more than 3 drinks per occasion.  TOBACCO USE: Significant reduction in smoking, primarily associated with  alcohol consumption. -Encourage continued reduction and cessation of tobacco use.  RECURRENT FORESKIN INJURY: Reports recurrent foreskin injury during sexual activity. Discussed potential benefits of circumcision. -Consider referral to urologist for circumcision if desired.                      Contains text generated by [employer]bridge.                                 Contains text generated by Abridge.

## 2024-05-17 NOTE — Assessment & Plan Note (Signed)
 Previous labs showed low HDL and slightly elevated LDL. - Order lipid panel as part of blood work. - Encourage increased physical activity and dietary modifications.

## 2024-05-18 ENCOUNTER — Ambulatory Visit: Payer: Self-pay

## 2024-05-18 DIAGNOSIS — S3994XA Unspecified injury of external genitals, initial encounter: Secondary | ICD-10-CM

## 2024-05-18 LAB — COMPREHENSIVE METABOLIC PANEL WITH GFR
ALT: 87 IU/L — ABNORMAL HIGH (ref 0–44)
AST: 42 IU/L — ABNORMAL HIGH (ref 0–40)
Albumin: 4.6 g/dL (ref 4.3–5.2)
Alkaline Phosphatase: 60 IU/L (ref 47–123)
BUN/Creatinine Ratio: 19 (ref 9–20)
BUN: 17 mg/dL (ref 6–20)
Bilirubin Total: 0.6 mg/dL (ref 0.0–1.2)
CO2: 21 mmol/L (ref 20–29)
Calcium: 9.4 mg/dL (ref 8.7–10.2)
Chloride: 102 mmol/L (ref 96–106)
Creatinine, Ser: 0.9 mg/dL (ref 0.76–1.27)
Globulin, Total: 2.3 g/dL (ref 1.5–4.5)
Glucose: 93 mg/dL (ref 70–99)
Potassium: 4.5 mmol/L (ref 3.5–5.2)
Sodium: 139 mmol/L (ref 134–144)
Total Protein: 6.9 g/dL (ref 6.0–8.5)
eGFR: 121 mL/min/1.73 (ref >59)

## 2024-05-18 LAB — LIPID PANEL
Chol/HDL Ratio: 4.5 ratio (ref 0.0–5.0)
Cholesterol, Total: 185 mg/dL (ref 100–199)
HDL: 41 mg/dL (ref >39)
LDL Chol Calc (NIH): 122 mg/dL — ABNORMAL HIGH (ref 0–99)
Triglycerides: 123 mg/dL (ref 0–149)
VLDL Cholesterol Cal: 22 mg/dL (ref 5–40)

## 2024-05-18 LAB — HEPATITIS C ANTIBODY: Hep C Virus Ab: NONREACTIVE

## 2024-05-18 LAB — HEMOGLOBIN A1C
Est. average glucose Bld gHb Est-mCnc: 117 mg/dL
Hgb A1c MFr Bld: 5.7 % — ABNORMAL HIGH (ref 4.8–5.6)

## 2024-05-19 ENCOUNTER — Telehealth: Payer: Self-pay

## 2024-05-19 NOTE — Telephone Encounter (Signed)
 Copied from CRM #[id number]. Topic: Referral - Status >> May 19, 2024  2:51 PM Tobias CROME wrote: Reason for CRM: [facility] calling to advise they do not complete circumcisions and will not be able to see patient at their office.

## 2024-05-20 ENCOUNTER — Other Ambulatory Visit: Payer: Self-pay

## 2024-06-15 ENCOUNTER — Encounter: Payer: Self-pay | Admitting: Urology

## 2024-06-15 ENCOUNTER — Ambulatory Visit: Admitting: Urology

## 2024-06-15 VITALS — BP 142/79 | HR 65 | Ht 70.0 in | Wt 267.0 lb

## 2024-06-15 DIAGNOSIS — N478 Other disorders of prepuce: Secondary | ICD-10-CM | POA: Diagnosis not present

## 2024-06-15 NOTE — Progress Notes (Signed)
 Assessment: 1. Redundant foreskin      Plan: I personally reviewed the patient's chart including provider notes. I discussed elective circumcision with the patient today.  I discussed the procedure including potential risk. Recommend use of lubricants during intercourse to decrease risk of injury to the frenulum. Return to office prn  Chief Complaint:  Chief Complaint  Patient presents with   Redundant foreskin     History of Present Illness:  Douglas Reyes is a 28 y.o. male who is seen in consultation from Sirivol, Mamatha, MD for evaluation of redundant foreskin. He was not circumcised as an infant.  He reported some tearing of the frenulum after intercourse several months ago.  He was seen in urgent care and advised to use topical antibiotic ointment.  The area healed without difficulty.  He has had intercourse since that time without difficulty.  No problems with retraction of the foreskin.  No history of balanitis or UTIs.  No lower urinary tract symptoms.   Past Medical History:  Past Medical History:  Diagnosis Date   Acute respiratory disease due to COVID-19 virus 02/08/2019   Asthma    as a child, no problems as an adult, no inhaler   Sepsis (HCC) 02/08/2019    Past Surgical History:  Past Surgical History:  Procedure Laterality Date   AMPUTATION TOE Left 02/04/2019   Procedure: AMPUTATION LEFT 2ND AND 3RD TOE;  Surgeon: Elsa Lonni SAUNDERS, MD;  Location: [facility] OR;  Service: Orthopedics;  Laterality: Left;   CLOSED REDUCTION METATARSAL Left 02/04/2019   Procedure: CLOSED REDUCTION OF HALLUX FRACTURE;  Surgeon: Elsa Lonni SAUNDERS, MD;  Location: [facility] OR;  Service: Orthopedics;  Laterality: Left;    Allergies:  Allergies  Allergen Reactions   Penicillins     Family History:  Family History  Problem Relation Age of Onset   Diabetes Mother    Hyperlipidemia Father    Breast cancer Paternal Aunt     Social History:  Social History   Tobacco Use    Smoking status: Every Day    Current packs/day: 0.10    Average packs/day: 0.1 packs/day for 3.0 years (0.3 ttl pk-yrs)    Types: Cigarettes   Smokeless tobacco: Never   Tobacco comments:    on weekends only  Vaping Use   Vaping status: Some Days   Start date: 08/26/2017   Substances: Nicotine , Flavoring   Devices: Juul  Substance Use Topics   Alcohol use: Yes    Alcohol/week: 8.0 standard drinks of alcohol    Types: 8 Cans of beer per week   Drug use: Never    Review of symptoms:  Constitutional:  Negative for unexplained weight loss, night sweats, fever, chills ENT:  Negative for nose bleeds, sinus pain, painful swallowing CV:  Negative for chest pain, shortness of breath, exercise intolerance, palpitations, loss of consciousness Resp:  Negative for cough, wheezing, shortness of breath GI:  Negative for nausea, vomiting, diarrhea, bloody stools GU:  Positives noted in HPI; otherwise negative for gross hematuria, dysuria, urinary incontinence Neuro:  Negative for seizures, poor balance, limb weakness, slurred speech Psych:  Negative for lack of energy, depression, anxiety Endocrine:  Negative for polydipsia, polyuria, symptoms of hypoglycemia (dizziness, hunger, sweating) Hematologic:  Negative for anemia, purpura, petechia, prolonged or excessive bleeding, use of anticoagulants  Allergic:  Negative for difficulty breathing or choking as a result of exposure to anything; no shellfish allergy; no allergic response (rash/itch) to materials, foods  Physical exam: BP (!) 142/79  Pulse 65   Ht 5' 10 (1.778 m)   Wt 267 lb (121.1 kg)   BMI 38.31 kg/m  GENERAL APPEARANCE:  Well appearing, well developed, well nourished, NAD HEENT:  Atraumatic, normocephalic, oropharynx clear NECK:  Supple without lymphadenopathy or thyromegaly ABDOMEN:  Soft, non-tender, no masses EXTREMITIES:  Moves all extremities well, without clubbing, cyanosis, or edema NEUROLOGIC:  Alert and oriented x  3, normal gait, CN II-XII grossly intact MENTAL STATUS:  appropriate BACK:  Non-tender to palpation, No CVAT SKIN:  Warm, dry, and intact GU: Penis: Uncircumcised; foreskin retracts easily; no balanitis Meatus: Normal Scrotum: normal, no masses Testis: normal without masses bilateral   Results: None

## 2025-05-19 ENCOUNTER — Encounter
